# Patient Record
Sex: Female | Born: 2000
Health system: Southern US, Community
[De-identification: ages and names within clinical notes are randomized; demographics above are authoritative.]

---
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
## 2019-01-09 DIAGNOSIS — S20219A Contusion of unspecified front wall of thorax, initial encounter: Secondary | ICD-10-CM | POA: Diagnosis not present

## 2019-01-09 MED FILL — DICLOFENAC SODIUM 75 MG TAB: 75 | 14 days supply | Qty: 28 | Fill #0

## 2019-02-17 DIAGNOSIS — Z23 Encounter for immunization: Secondary | ICD-10-CM | POA: Diagnosis not present

## 2019-04-14 ENCOUNTER — Other Ambulatory Visit: Payer: Self-pay | Admitting: Family Medicine

## 2019-04-14 ENCOUNTER — Other Ambulatory Visit: Payer: Self-pay

## 2019-04-14 ENCOUNTER — Encounter (HOSPITAL_COMMUNITY): Payer: Self-pay

## 2019-04-14 ENCOUNTER — Ambulatory Visit (HOSPITAL_COMMUNITY)
Admission: EM | Admit: 2019-04-14 | Discharge: 2019-04-14 | Disposition: A | Discharge status: Home / Self Care | Payer: 59 | Attending: Family Medicine | Admitting: Family Medicine

## 2019-04-14 DIAGNOSIS — R300 Dysuria: Secondary | ICD-10-CM | POA: Diagnosis not present

## 2019-04-14 DIAGNOSIS — Z113 Encounter for screening for infections with a predominantly sexual mode of transmission: Secondary | ICD-10-CM | POA: Insufficient documentation

## 2019-04-14 LAB — POCT URINALYSIS DIP (DEVICE)
Bilirubin Urine: NEGATIVE
Glucose, UA: NEGATIVE mg/dL
Ketones, ur: NEGATIVE mg/dL
Leukocytes,Ua: NEGATIVE
Nitrite: NEGATIVE
Protein, ur: NEGATIVE mg/dL
Specific Gravity, Urine: <=1.005 (ref 1.005–1.030)
Urobilinogen, UA: 0.2 mg/dL (ref 0.0–1.0)
pH: 6 (ref 5.0–8.0)

## 2019-04-14 MED ORDER — CEPHALEXIN 500 MG PO CAPS: 500.0000 mg | ORAL_CAPSULE | Freq: Two times a day (BID) | ORAL | 0 refills | Status: DC

## 2019-04-14 NOTE — ED Triage Notes (Signed)
Pt presents with urinary tract symptoms; pressure on bladder and abdominal pain.

## 2019-04-14 NOTE — Discharge Instructions (Addendum)
We have sent testing for sexually transmitted infections along with a urine culture. We will notify you of any positive results once they are received. If required, we will prescribe any medications you might need.  Please refrain from all sexual activity for at least the next seven days.  

## 2019-04-14 NOTE — ED Provider Notes (Signed)
Lori Gomez   [id number] 04/14/19 Arrival Time: 1841  ASSESSMENT & PLAN:  1. Dysuria   2. Screening for STDs (sexually transmitted diseases)     Declines empiric tx for gonorrhea/chlamydia. Prefers to await cytology results. No signs of PID. Will treat for suspected UTI.   Discharge Instructions     We have sent testing for sexually transmitted infections along with a urine culture. We will notify you of any positive results once they are received. If required, we will prescribe any medications you might need.  Please refrain from all sexual activity for at least the next seven days.     Pending: Labs Reviewed  POCT URINALYSIS DIP (DEVICE) - Abnormal; Notable for the following components:      Result Value   Hgb urine dipstick TRACE (*)    All other components within normal limits  URINE CULTURE  CERVICOVAGINAL ANCILLARY ONLY    Will notify of any positive results. Instructed to refrain from sexual activity for at least seven days.  Reviewed expectations re: course of current medical issues. Questions answered. Outlined signs and symptoms indicating need for more acute intervention. Patient verbalized understanding. After Visit Summary given.   SUBJECTIVE:  Lori Gomez is a 18 y.o. female who presents with complaint of dysuria. Occasional urinary frequency. Onset gradual. First noticed "maybe a week or so ago"; worse the past few days. No vaginal discharge or bleeding. Afebrile. No abdominal or pelvic pain. No n/v. No rashes or lesions. Reports that she s sexually active with single female partner.Normal PO intake without n/v/d. OTC treatment: none. History of STI: No. No h/o recurrent UTI. No new medications.  Patient's last menstrual period was 04/06/2019.  ROS: As per HPI. All other systems negative.   OBJECTIVE:  Vitals:   04/14/19 1853  BP: 127/73  Pulse: 81  Resp: 18  Temp: 98 F (36.7 C)  TempSrc: Oral  SpO2: 100%     General  appearance: alert, cooperative, appears stated age and no distress Throat: lips, mucosa, and tongue normal; teeth and gums normal CV: RRR Lungs: CTAB Back: no CVA tenderness; FROM at waist Abdomen: soft, non-tender; non-distended GU: deferred Skin: warm and dry Ext: no LE edema Psychological: alert and cooperative; normal mood and affect.  Results for orders placed or performed during the hospital encounter of 04/14/19  POCT urinalysis dip (device)  Result Value Ref Range   Glucose, UA NEGATIVE NEGATIVE mg/dL   Bilirubin Urine NEGATIVE NEGATIVE   Ketones, ur NEGATIVE NEGATIVE mg/dL   Specific Gravity, Urine <=1.005 1.005 - 1.030   Hgb urine dipstick TRACE (A) NEGATIVE   pH 6.0 5.0 - 8.0   Protein, ur NEGATIVE NEGATIVE mg/dL   Urobilinogen, UA 0.2 0.0 - 1.0 mg/dL   Nitrite NEGATIVE NEGATIVE   Leukocytes,Ua NEGATIVE NEGATIVE    Labs Reviewed  POCT URINALYSIS DIP (DEVICE) - Abnormal; Notable for the following components:      Result Value   Hgb urine dipstick TRACE (*)    All other components within normal limits  URINE CULTURE  CERVICOVAGINAL ANCILLARY ONLY    Allergies  Allergen Reactions  . Kiwi Extract Hives    PMH: "Healthy". FH: Question of HTN. Social History   Socioeconomic History  . Marital status: Single    Spouse name: Not on file  . Number of children: Not on file  . Years of education: Not on file  . Highest education level: Not on file  Occupational History  . Not on file  Social Needs  . Financial resource strain: Not on file  . Food insecurity    Worry: Not on file    Inability: Not on file  . Transportation needs    Medical: Not on file    Non-medical: Not on file  Tobacco Use  . Smoking status: Never Smoker  . Smokeless tobacco: Never Used  Substance and Sexual Activity  . Alcohol use: Not on file  . Drug use: Not on file  . Sexual activity: Not on file  Lifestyle  . Physical activity    Days per week: Not on file    Minutes per  session: Not on file  . Stress: Not on file  Relationships  . Social Musician on phone: Not on file    Gets together: Not on file    Attends religious service: Not on file    Active member of club or organization: Not on file    Attends meetings of clubs or organizations: Not on file    Relationship status: Not on file  . Intimate partner violence    Fear of current or ex partner: Not on file    Emotionally abused: Not on file    Physically abused: Not on file    Forced sexual activity: Not on file  Other Topics Concern  . Not on file  Social History Narrative  . Not on file          Lori Layman, MD 04/14/19 [phone]

## 2019-04-15 LAB — CERVICOVAGINAL ANCILLARY ONLY
Bacterial Vaginitis (gardnerella): NEGATIVE
Candida Glabrata: POSITIVE — AB
Candida Vaginitis: NEGATIVE
Molecular Disclaimer: NEGATIVE
Molecular Disclaimer: NEGATIVE
Molecular Disclaimer: NEGATIVE
Molecular Disclaimer: NORMAL
Trichomonas: NEGATIVE

## 2019-04-15 LAB — URINE CULTURE: Culture: NO GROWTH

## 2019-04-15 MED FILL — CEPHALEXIN 500 MG CAPSULE: 500 | 5 days supply | Qty: 10 | Fill #0

## 2019-04-16 ENCOUNTER — Telehealth (HOSPITAL_COMMUNITY): Payer: Self-pay | Admitting: Emergency Medicine

## 2019-04-16 LAB — CERVICOVAGINAL ANCILLARY ONLY
Chlamydia: NEGATIVE
Neisseria Gonorrhea: NEGATIVE

## 2019-04-16 MED ORDER — FLUCONAZOLE 150 MG PO TABS: 150.0000 mg | ORAL_TABLET | Freq: Once | ORAL | 0 refills | Status: AC

## 2019-04-16 NOTE — Telephone Encounter (Signed)
Test for candida (yeast) was positive.  Prescription for fluconazole 150mg po now, repeat dose in 3d if needed, #2 no refills, sent to the pharmacy of record.  Recheck or followup with PCP for further evaluation if symptoms are not improving.    Patient contacted and made aware of    results, all questions answered   

## 2019-04-17 MED FILL — FLUCONAZOLE 150 MG TABLET: 150 | 2 days supply | Qty: 2 | Fill #0

## 2019-05-06 MED FILL — HYDROCODON-APAP 7.5-325: 7.5-325 | 2 days supply | Qty: 8 | Fill #0

## 2019-05-06 MED FILL — KETOROLAC 10 MG TABLET: 10 | 4 days supply | Qty: 16 | Fill #0

## 2019-06-03 DIAGNOSIS — M25552 Pain in left hip: Secondary | ICD-10-CM | POA: Diagnosis not present

## 2019-10-02 ENCOUNTER — Other Ambulatory Visit: Payer: Self-pay

## 2019-10-02 ENCOUNTER — Ambulatory Visit (INDEPENDENT_AMBULATORY_CARE_PROVIDER_SITE_OTHER): Payer: 59 | Admitting: Addiction (Substance Use Disorder)

## 2019-10-02 ENCOUNTER — Encounter: Payer: Self-pay | Admitting: Addiction (Substance Use Disorder)

## 2019-10-02 ENCOUNTER — Encounter (INDEPENDENT_AMBULATORY_CARE_PROVIDER_SITE_OTHER): Payer: Self-pay

## 2019-10-02 DIAGNOSIS — F4310 Post-traumatic stress disorder, unspecified: Secondary | ICD-10-CM

## 2019-10-02 DIAGNOSIS — F331 Major depressive disorder, recurrent, moderate: Secondary | ICD-10-CM | POA: Diagnosis not present
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
## 2019-10-06 ENCOUNTER — Ambulatory Visit (INDEPENDENT_AMBULATORY_CARE_PROVIDER_SITE_OTHER): Payer: 59 | Admitting: Adult Health

## 2019-10-06 ENCOUNTER — Encounter: Payer: Self-pay | Admitting: Adult Health

## 2019-10-06 VITALS — BP 124/81 | HR 76 | Ht 68.0 in | Wt 145.0 lb

## 2019-10-06 DIAGNOSIS — F411 Generalized anxiety disorder: Secondary | ICD-10-CM | POA: Diagnosis not present

## 2019-10-06 DIAGNOSIS — F331 Major depressive disorder, recurrent, moderate: Secondary | ICD-10-CM | POA: Diagnosis not present

## 2019-10-06 DIAGNOSIS — F4310 Post-traumatic stress disorder, unspecified: Secondary | ICD-10-CM | POA: Diagnosis not present

## 2019-10-06 DIAGNOSIS — F422 Mixed obsessional thoughts and acts: Secondary | ICD-10-CM

## 2019-10-06 MED ORDER — FLUOXETINE HCL 10 MG PO CAPS
ORAL_CAPSULE | ORAL | 2 refills | Status: DC
Start: 2019-10-06 — End: 2019-11-03

## 2019-10-06 MED FILL — FLUoxetine HCL 10 MG CAPS: 10 | 30 days supply | Qty: 60 | Fill #0
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
## 2019-10-30 DIAGNOSIS — R0789 Other chest pain: Secondary | ICD-10-CM | POA: Diagnosis not present

## 2019-10-30 DIAGNOSIS — F419 Anxiety disorder, unspecified: Secondary | ICD-10-CM | POA: Diagnosis not present

## 2019-10-30 DIAGNOSIS — R002 Palpitations: Secondary | ICD-10-CM | POA: Diagnosis not present

## 2019-11-03 ENCOUNTER — Encounter: Payer: Self-pay | Admitting: Adult Health

## 2019-11-03 ENCOUNTER — Other Ambulatory Visit: Payer: Self-pay

## 2019-11-03 ENCOUNTER — Telehealth: Payer: Self-pay | Admitting: Adult Health

## 2019-11-03 ENCOUNTER — Ambulatory Visit (INDEPENDENT_AMBULATORY_CARE_PROVIDER_SITE_OTHER): Payer: 59 | Admitting: Adult Health

## 2019-11-03 DIAGNOSIS — F422 Mixed obsessional thoughts and acts: Secondary | ICD-10-CM | POA: Diagnosis not present

## 2019-11-03 DIAGNOSIS — F411 Generalized anxiety disorder: Secondary | ICD-10-CM

## 2019-11-03 DIAGNOSIS — F4310 Post-traumatic stress disorder, unspecified: Secondary | ICD-10-CM

## 2019-11-03 DIAGNOSIS — F331 Major depressive disorder, recurrent, moderate: Secondary | ICD-10-CM | POA: Diagnosis not present

## 2019-11-03 MED ORDER — BUPROPION HCL ER (XL) 150 MG PO TB24: 150.0000 mg | ORAL_TABLET | Freq: Every day | ORAL | 2 refills | Status: DC

## 2019-11-03 MED FILL — buPROPion HCL ER (XL) 150 M: 150 | 30 days supply | Qty: 30 | Fill #0

## 2019-11-03 NOTE — Progress Notes (Signed)
PAT Lori Gomez [id number] [birth year] 18 y.o.  Subjective:   Patient ID:  Lori Gomez is a 19 y.o. (DOB [birth year]) female.  Chief Complaint: No chief complaint on file.   HPI Lori Gomez presents to the office today for follow-up of MDD, GAD, OCD, PTSD.   Describes mood today as "not good".  Pleasant. Flat. Mood symptoms - reports depression, anxiety, and irritability. Increased worry and rumination - schoolwork - getting assignments done. Having panic attacks. Did not tolerate Prozac - made her "more anxious, nauseous, and vomiting. Stating "I feel sad". Can't cut her mind off to go to sleep at night. Ritual and routines - checking stove, doors, taking clothes off if she doesn't put them on right. Anxious in crowds. Stable interest and motivation. Taking medications as prescribed.  Energy levels low. Active, does not have a regular exercise routine. 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 Full time student at [employer] - "classes ok".  Enjoys some usual interests and activities. Single. Dating. Lives at home with parents - 1 brother and 1 sister. Spending time with family. Extended family local. Appetite adequate.  Weight stable - 145 pounds. Sleeps better some nights than others. Averages 4 hours. Unable to turn her "brain" off.  Focus and concentration difficulties. Diagnosed with ADHD in second grade. Has taken stimulants. Is at [employer] currently and majoring in [employer]. Completing tasks. Managing aspects of household.  Denies SI or HI. Denies AH or VH.   Review of Systems:  Review of Systems  Musculoskeletal: Negative for gait problem.  Neurological: Negative for tremors.  Psychiatric/Behavioral:       Please refer to HPI    Medications: I have reviewed the patient's current medications.  Current Outpatient Medications  Medication Sig Dispense Refill  . buPROPion (WELLBUTRIN XL) 150 MG 24 hr tablet Take 1  tablet (150 mg total) by mouth daily. 30 tablet 2   No current facility-administered medications for this visit.    Medication Side Effects: None  Allergies:  Allergies  Allergen Reactions  . Kiwi Extract Hives    No past medical history on file.  Family History  Family history unknown: Yes    Social History   Socioeconomic History  . Marital status: Single    Spouse name: Not on file  . Number of children: Not on file  . Years of education: Not on file  . Highest education level: Not on file  Occupational History  . Not on file  Tobacco Use  . Smoking status: Never Smoker  . Smokeless tobacco: Never Used  Substance and Sexual Activity  . Alcohol use: Not on file  . Drug use: Not on file  . Sexual activity: Not on file  Other Topics Concern  . Not on file  Social History Narrative  . Not on file   Social Determinants of Health   Financial Resource Strain:   . Difficulty of Paying Living Expenses:   Food Insecurity:   . Worried About Programme researcher, broadcasting/film/video in the Last Year:   . Barista in the Last Year:   Transportation Needs:   . Freight forwarder (Medical):   Marland Kitchen Lack of Transportation (Non-Medical):   Physical Activity:   . Days of Exercise per Week:   . Minutes of Exercise per Session:   Stress:   . Feeling of Stress :   Social Connections:   .  Frequency of Communication with Friends and Family:   . Frequency of Social Gatherings with Friends and Family:   . Attends Religious Services:   . Active Member of Clubs or Organizations:   . Attends Archivist Meetings:   Marland Kitchen Marital Status:   Intimate Partner Violence:   . Fear of Current or Ex-Partner:   . Emotionally Abused:   Marland Kitchen Physically Abused:   . Sexually Abused:     Past Medical History, Surgical history, Social history, and Family history were reviewed and updated as appropriate.   Please see review of systems for further details on the patient's review from today.    Objective:   Physical Exam:  There were no vitals taken for this visit.  Physical Exam Constitutional:      General: She is not in acute distress. Musculoskeletal:        General: No deformity.  Neurological:     Mental Status: She is alert and oriented to person, place, and time.     Coordination: Coordination normal.  Psychiatric:        Attention and Perception: Attention and perception normal. She does not perceive auditory or visual hallucinations.        Mood and Affect: Mood is anxious and depressed. Affect is not labile, blunt, angry or inappropriate.        Speech: Speech normal.        Behavior: Behavior normal.        Thought Content: Thought content normal. Thought content is not paranoid or delusional. Thought content does not include homicidal or suicidal ideation. Thought content does not include homicidal or suicidal plan.        Cognition and Memory: Cognition and memory normal.        Judgment: Judgment normal.     Comments: Insight intact     Lab Review:  No results found for: NA, K, CL, CO2, GLUCOSE, BUN, CREATININE, CALCIUM, PROT, ALBUMIN, AST, ALT, ALKPHOS, BILITOT, GFRNONAA, GFRAA  No results found for: WBC, RBC, HGB, HCT, PLT, MCV, MCH, MCHC, RDW, LYMPHSABS, MONOABS, EOSABS, BASOSABS  No results found for: POCLITH, LITHIUM   No results found for: PHENYTOIN, PHENOBARB, VALPROATE, CBMZ   .res Assessment: Plan:    Plan:  PDMP reviewed  1. D/C Prozac 20mg  daily 2. Add Wellbutrin XL 150mg  every morning  Continue therapy - Sammuel Cooper  Out of work 10/30/2019 through 12/14/2019    RTC 4 weeks  Spoke with mother via telephone about patient during interview and agreed on Wellbutrin. Mother taking Wellbutrin and has similar symptoms and has not tolerated SSRI's.   Patient advised to contact office with any questions, adverse effects, or acute worsening in signs and symptoms. Diagnoses and all orders for this visit:  Mixed obsessional thoughts  and acts  Major depressive disorder, recurrent episode, moderate ([facility]) -     buPROPion (WELLBUTRIN XL) 150 MG 24 hr tablet; Take 1 tablet (150 mg total) by mouth daily.  Generalized anxiety disorder  PTSD (post-traumatic stress disorder)     Please see After Visit Summary for patient specific instructions.  Future Appointments  Date Time Provider [place]  11/05/2019  1:00 PM Barnie Del, LCSW CP-CP None  11/19/2019  1:00 PM Barnie Del, LCSW CP-CP None  11/27/2019  8:00 AM Barnie Del, LCSW CP-CP None  12/04/2019  8:00 AM Barnie Del, LCSW CP-CP None  12/11/2019  8:00 AM Barnie Del, LCSW CP-CP None  12/18/2019  8:00 AM  Pauline Good, LCSW CP-CP None    No orders of the defined types were placed in this encounter.   -------------------------------

## 2019-11-03 NOTE — Telephone Encounter (Signed)
Error msg in IM

## 2019-11-05 ENCOUNTER — Other Ambulatory Visit: Payer: Self-pay

## 2019-11-05 ENCOUNTER — Encounter: Payer: Self-pay | Admitting: Addiction (Substance Use Disorder)

## 2019-11-05 ENCOUNTER — Ambulatory Visit (INDEPENDENT_AMBULATORY_CARE_PROVIDER_SITE_OTHER): Payer: 59 | Admitting: Addiction (Substance Use Disorder)

## 2019-11-05 DIAGNOSIS — F411 Generalized anxiety disorder: Secondary | ICD-10-CM

## 2019-11-05 DIAGNOSIS — F4310 Post-traumatic stress disorder, unspecified: Secondary | ICD-10-CM | POA: Diagnosis not present

## 2019-11-05 NOTE — Progress Notes (Signed)
[facility] Counselor/Therapist Progress Note  Patient ID: Lori Gomez, MRN: [id number],    Date: 11/05/2019  Time Spent: 1:06-2:00 54 mins  Treatment Type: Individual Therapy  Reported Symptoms: panic attacks, racing thoughts, skin crawling, disassociation, wasn't eating or sleeping, isolating, depressed.   Mental Status Exam:  Appearance:   Neat     Behavior:  Appropriate  Motor:  Normal  Speech/Language:   Normal Rate  Affect:  Congruent and Full Range  Mood:  anxious, depressed, irritable and sad  Thought process:  blocked  Thought content:    Obsessions and Rumination  Sensory/Perceptual disturbances:    Flashback  Orientation:  x4  Attention:  Good  Concentration:  Fair  Memory:  WNL  Fund of knowledge:   Good  Insight:    Good  Judgment:   Fair  Impulse Control:  Fair   Risk Assessment: Danger to Self:  No Self-injurious Behavior: No Danger to Others: No Duty to Warn:no Physical Aggression / Violence:No  Access to Firearms a concern: No  Gang Involvement:No   Subjective: Client identified having a lot of panic attacks, racing thoughts, skin crawling, disassociation, etc with taking Prozac. Client discussed her change of medication to help treat her depression and PTSD. Client processed how she struggles with depression, hopelessness, and isolating. Therapist used MI to provide support for the client's struggle and assessed for safety. Client declined SI/HI/AVH and reported improvement with new medication. Client discussed attachment issues and traumas that cause disruption in relationships. Client reported that its easier to admit that shes mad than sad.Therapist used MI & CBT to help client have support and explore the trigger for isolating herself and running away. Therapist used psychoeducation to teach client about trauma & the fight or flight reactions that come when theres threat, normalizing what she experiences and helping her use DBT techniques to ground  her in the moment of disassociation or trauma flooding. Client shared that when relationships get vulnerable, too real, too intimate, she feels exposed, naked, and afraid. Client made progress identifying her patterns and discussing how they affect relationship dynamics. Client reported hiding her vulnerability and sexuality from her dad to protect her relationship with her dad. Therapist used grief therapy and family therapy to help client work through the fears to help her develop a deeper connection with her dad and feel connected to him. Therapist used roleplaying to help client consider topics of discussion with him. Therapist also used 12 steps to encourage client to continue considering pros and cons of her THC smoking and her motivations for stopping.   Interventions: Cognitive Behavioral Therapy, Dialectical Behavioral Therapy, Motivational Interviewing, 12-Step, Grief Therapy and Psycho-education/Bibliotherapy  Diagnosis:   ICD-10-CM   1. PTSD (post-traumatic stress disorder)  F43.10   2. Generalized anxiety disorder  F41.1     Plan of Care:  Client is to return to therapy with therapist every 1-2 weeks as needed to process pain/grief/traumas in a safe space, to be re-evaluated in 3 months.  Client is to consider seeing a medication provider to explore possible need for medication to help them feel more like themselves. Client is to practice mindfulness AEB daily meditation and body scans or as needed when flooded by emotion/pain/flashbacks or feeling numb.  Client is to learn/practice DBT wise mind & radical acceptance AEB understanding they don't have to live in extremes and can feel both happy and sad emotions, and good and bad memories and allowing themselves to do so.  Client is  to practice self-compassion AEB being gentle with themselves, utilizing self-care techniques daily or as needed when grieving something in the moment.  Client is to process grief/pain in a somatic body-felt  sense way: ie using mindfulness, brainspotting, or trauma release as a method for releasing body pain/tension caused by grief. Client to step outside of her comfort zone to really be "herself".  Pauline Good, LCSW, LCAS, CCTP, CCS-I, BSP

## 2019-11-09 DIAGNOSIS — M542 Cervicalgia: Secondary | ICD-10-CM | POA: Diagnosis not present

## 2019-11-09 DIAGNOSIS — M549 Dorsalgia, unspecified: Secondary | ICD-10-CM | POA: Diagnosis not present

## 2019-11-09 MED FILL — BACLOFEN 5 MG TABS: 5 | 5 days supply | Qty: 10 | Fill #0

## 2019-11-10 ENCOUNTER — Other Ambulatory Visit: Payer: Self-pay

## 2019-11-10 ENCOUNTER — Ambulatory Visit: Payer: 59 | Admitting: Addiction (Substance Use Disorder)

## 2019-11-10 ENCOUNTER — Ambulatory Visit (INDEPENDENT_AMBULATORY_CARE_PROVIDER_SITE_OTHER): Payer: 59 | Admitting: Addiction (Substance Use Disorder)

## 2019-11-10 DIAGNOSIS — F4310 Post-traumatic stress disorder, unspecified: Secondary | ICD-10-CM | POA: Diagnosis not present

## 2019-11-10 DIAGNOSIS — F411 Generalized anxiety disorder: Secondary | ICD-10-CM

## 2019-11-11 ENCOUNTER — Encounter: Payer: Self-pay | Admitting: Addiction (Substance Use Disorder)
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
## 2019-11-14 ENCOUNTER — Ambulatory Visit: Payer: 59 | Attending: Internal Medicine

## 2019-11-14 DIAGNOSIS — Z23 Encounter for immunization: Secondary | ICD-10-CM

## 2019-11-14 NOTE — Progress Notes (Signed)
   Covid-19 Vaccination Clinic  Name:  LUXE CUADROS    MRN: [id number] DOB: 12-Jun-2001  11/14/2019  Ms. Backes was observed post Covid-19 immunization for 15 minutes without incident. She was provided with Vaccine Information Sheet and instruction to access the V-Safe system.   Ms. Nuss was instructed to call 911 with any severe reactions post vaccine: Marland Kitchen Difficulty breathing  . Swelling of face and throat  . A fast heartbeat  . A bad rash all over body  . Dizziness and weakness   Immunizations Administered    Name Date Dose VIS Date Route   Pfizer COVID-19 Vaccine 11/14/2019  2:08 PM 0.3 mL 09/16/2018 Intramuscular   Manufacturer: [employer], [employer]   Lot: [id number]   NDC: [address]-4

## 2019-11-19 ENCOUNTER — Other Ambulatory Visit: Payer: Self-pay

## 2019-11-19 ENCOUNTER — Ambulatory Visit (INDEPENDENT_AMBULATORY_CARE_PROVIDER_SITE_OTHER): Payer: 59 | Admitting: Addiction (Substance Use Disorder)

## 2019-11-19 ENCOUNTER — Encounter: Payer: Self-pay | Admitting: Addiction (Substance Use Disorder)

## 2019-11-19 DIAGNOSIS — F411 Generalized anxiety disorder: Secondary | ICD-10-CM

## 2019-11-19 DIAGNOSIS — F4310 Post-traumatic stress disorder, unspecified: Secondary | ICD-10-CM

## 2019-11-19 DIAGNOSIS — F3181 Bipolar II disorder: Secondary | ICD-10-CM | POA: Diagnosis not present

## 2019-11-19 NOTE — Progress Notes (Signed)
[facility] Counselor/Therapist Progress Note  Patient ID: Lori Gomez, MRN: [id number],    Date: 11/19/2019  Time Spent: 1:06-2:00 54 mins  Treatment Type: Individual Therapy  Reported Symptoms: feeling good, less depressed, isolating, hopeful about semester ending.   Mental Status Exam:  Appearance:   Casual     Behavior:  Appropriate  Motor:  Normal  Speech/Language:   Normal Rate  Affect:  Congruent and Full Range  Mood:  euthymic  Thought process:  circumstantial and concrete  Thought content:    Obsessions and Rumination  Sensory/Perceptual disturbances:    Flashback  Orientation:  x4  Attention:  Good  Concentration:  Fair  Memory:  WNL  Fund of knowledge:   Good  Insight:    Good  Judgment:   Good  Impulse Control:  Fair   Risk Assessment: Danger to Self:  No Self-injurious Behavior: No Danger to Others: No Duty to Warn:no Physical Aggression / Violence:No  Access to Firearms a concern: No  Gang Involvement:No   Subjective: Client identified most recently feeling "better" following the last session, and being more aware of the guilt that shes choosing to take on as her own. Client making progress by realizing what I can and cant control. Therapist further worked with client on this using PST& SFT to set goals for herself to help her not get irritated/triggered as much. Client identified possible mood swings between hypomania and depression rapidly (and sometimes daily) and due to circumstances. Client reported feeling less depressed since starting Wellbutrin but still having some of the big mood swings that come with obsessions, compulsions, high energy, grandiose thoughts, etc. Client reported also sleeping only like 4 unconsecutive hours total a night and having racing thoughts, for the last 2 weeks. Client reported some of this may be magnified by circumstantial things such as: being more hopeful about college semester ending & isolating herself from toxic  people that cause her stress/anger/ and sadness. Therapist taught client DBT techniques such as: emotion regulation and distress tolerance skills to help client find ways to channel her frustration and psychoeducation to help her understand this from a trauma perspective. Therapist used MI to help validate client's distress and taught her have compassion on herself while she learns a new pattern. Client reported gaining insight & learning how to express emotions.  Interventions: Dialectical Behavioral Therapy, Motivational Interviewing and Solution-Oriented/Positive Psychology  Diagnosis:   ICD-10-CM   1. PTSD (post-traumatic stress disorder)  F43.10   2. Generalized anxiety disorder  F41.1   3. Bipolar II disorder (HCC)  F31.81     Plan of Care:  Client is to return to therapy with therapist every 1-2 weeks as needed to process pain/grief/traumas in a safe space, to be re-evaluated in 3 months.  Client is to consider seeing a medication provider to explore possible need for medication to help them feel more like themselves. Client is to practice mindfulness AEB daily meditation and body scans or as needed when flooded by emotion/pain/flashbacks or feeling numb.  Client is to learn/practice DBT wise mind & radical acceptance AEB understanding they don't have to live in extremes and can feel both happy and sad emotions, and good and bad memories and allowing themselves to do so.  Client is to practice self-compassion AEB being gentle with themselves, utilizing self-care techniques daily or as needed when grieving something in the moment.  Client is to process grief/pain in a somatic body-felt sense way: ie using mindfulness, brainspotting, or  trauma release as a method for releasing body pain/tension caused by grief. Client to step outside of her comfort zone to really be "herself".  Barnie Del, LCSW, LCAS, CCTP, CCS-I, BSP

## 2019-11-27 ENCOUNTER — Encounter: Payer: Self-pay | Admitting: Addiction (Substance Use Disorder)

## 2019-11-27 ENCOUNTER — Ambulatory Visit (INDEPENDENT_AMBULATORY_CARE_PROVIDER_SITE_OTHER): Payer: 59 | Admitting: Addiction (Substance Use Disorder)

## 2019-11-27 ENCOUNTER — Other Ambulatory Visit: Payer: Self-pay

## 2019-11-27 DIAGNOSIS — F411 Generalized anxiety disorder: Secondary | ICD-10-CM | POA: Diagnosis not present

## 2019-11-27 DIAGNOSIS — F4310 Post-traumatic stress disorder, unspecified: Secondary | ICD-10-CM | POA: Diagnosis not present
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
## 2019-12-01 ENCOUNTER — Other Ambulatory Visit: Payer: Self-pay

## 2019-12-01 ENCOUNTER — Ambulatory Visit (INDEPENDENT_AMBULATORY_CARE_PROVIDER_SITE_OTHER): Payer: 59 | Admitting: Adult Health

## 2019-12-01 ENCOUNTER — Encounter: Payer: Self-pay | Admitting: Adult Health

## 2019-12-01 DIAGNOSIS — F3181 Bipolar II disorder: Secondary | ICD-10-CM

## 2019-12-01 DIAGNOSIS — F331 Major depressive disorder, recurrent, moderate: Secondary | ICD-10-CM

## 2019-12-01 DIAGNOSIS — F4310 Post-traumatic stress disorder, unspecified: Secondary | ICD-10-CM

## 2019-12-01 DIAGNOSIS — F422 Mixed obsessional thoughts and acts: Secondary | ICD-10-CM | POA: Diagnosis not present

## 2019-12-01 DIAGNOSIS — F411 Generalized anxiety disorder: Secondary | ICD-10-CM | POA: Diagnosis not present
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
## 2019-12-04 ENCOUNTER — Encounter: Payer: Self-pay | Admitting: Addiction (Substance Use Disorder)

## 2019-12-04 ENCOUNTER — Ambulatory Visit (INDEPENDENT_AMBULATORY_CARE_PROVIDER_SITE_OTHER): Payer: 59 | Admitting: Addiction (Substance Use Disorder)

## 2019-12-04 ENCOUNTER — Other Ambulatory Visit: Payer: Self-pay

## 2019-12-04 DIAGNOSIS — F411 Generalized anxiety disorder: Secondary | ICD-10-CM

## 2019-12-04 DIAGNOSIS — F4310 Post-traumatic stress disorder, unspecified: Secondary | ICD-10-CM

## 2019-12-04 NOTE — Progress Notes (Signed)
[facility] Counselor/Therapist Progress Note  Patient ID: ALESA ECHEVARRIA, MRN: [id number],    Date: 12/04/2019  Time Spent: 8:10-9:04  Treatment Type: Individual Therapy  Reported Symptoms: less dull feelings, pain from surgery, continued obsessive thoughts.    Mental Status Exam:  Appearance:   Casual     Behavior:  Appropriate  Motor:  Normal  Speech/Language:   Normal Rate  Affect:  Congruent and Full Range  Mood:  anxious and irritable  Thought process:  circumstantial and concrete  Thought content:    Obsessions and Rumination  Sensory/Perceptual disturbances:    Flashback  Orientation:  x4  Attention:  Good  Concentration:  Fair  Memory:  WNL  Fund of knowledge:   Good  Insight:    Good  Judgment:   Good  Impulse Control:  Fair   Risk Assessment: Danger to Self:  No Self-injurious Behavior: No Danger to Others: No Duty to Warn:no Physical Aggression / Violence:No  Access to Firearms a concern: No  Gang Involvement:No   Subjective: Client reported stopping her SSRI because it made her feel sleepy and dull/numb, but still having obsessive thoughts, anxiety, and trouble sleeping. Client discussed her goal of not wanting to live in fear anymore and fighting the obsessions: that include not being safe leaving the house or hanging out with new people. Client has also had less stress due to the semester being over and feeling acocmplished by making all As. Therapist used MI  to validate client's progress and encourage her to continue learning how to manage her anxiety and SFT to help her find the solution to help her sleep and decreasing her racing mind. Client described other stressors & reported getting her wisdom teeth out yesterday and being in a lot of pain and not having slept last night. Client shared about an allergic reaction to the anesthesia causing hives and her throat to close up. Client discussed her need for pain relief and trying to stop smoking THC  and using CBD instead (harm reduction) for anxiety also. Client shared an argument with her mom about her control of the client, and therapist used family systems with client.  Interventions: Motivational Interviewing, Solution-Oriented/Positive Psychology, Family Systems and harm reduction  Diagnosis:   ICD-10-CM   1. Generalized anxiety disorder  F41.1   2. PTSD (post-traumatic stress disorder)  F43.10     Plan of Care:  Client is to return to therapy with therapist every 1-2 weeks as needed to process pain/grief/traumas in a safe space, to be re-evaluated in 3 months.  Client is to consider seeing a medication provider to explore possible need for medication to help them feel more like themselves. Client is to practice mindfulness AEB daily meditation and body scans or as needed when flooded by emotion/pain/flashbacks or feeling numb.  Client is to learn/practice DBT wise mind & radical acceptance AEB understanding they don't have to live in extremes and can feel both happy and sad emotions, and good and bad memories and allowing themselves to do so.  Client is to practice self-compassion AEB being gentle with themselves, utilizing self-care techniques daily or as needed when grieving something in the moment.  Client is to process grief/pain in a somatic body-felt sense way: ie using mindfulness, brainspotting, or trauma release as a method for releasing body pain/tension caused by grief. Client to step outside of her comfort zone to really be "herself".  Pauline Good, LCSW, LCAS, CCTP, CCS-I, BSP

## 2019-12-07 ENCOUNTER — Ambulatory Visit: Payer: 59 | Attending: Internal Medicine

## 2019-12-07 DIAGNOSIS — Z23 Encounter for immunization: Secondary | ICD-10-CM

## 2019-12-07 NOTE — Progress Notes (Signed)
   Covid-19 Vaccination Clinic  Name:  Lori Gomez    MRN: [id number] DOB: 30-Apr-2001  12/07/2019  Lori Gomez was observed post Covid-19 immunization for 15 minutes without incident. She was provided with Vaccine Information Sheet and instruction to access the V-Safe system.   Lori Gomez was instructed to call 911 with any severe reactions post vaccine: Marland Kitchen Difficulty breathing  . Swelling of face and throat  . A fast heartbeat  . A bad rash all over body  . Dizziness and weakness   Immunizations Administered    Name Date Dose VIS Date Route   Pfizer COVID-19 Vaccine 12/07/2019  8:34 AM 0.3 mL 09/16/2018 Intramuscular   Manufacturer: [employer], [employer]   Lot: [id number]   NDC: [address]-6

## 2019-12-11 ENCOUNTER — Other Ambulatory Visit: Payer: Self-pay

## 2019-12-11 ENCOUNTER — Ambulatory Visit (INDEPENDENT_AMBULATORY_CARE_PROVIDER_SITE_OTHER): Payer: 59 | Admitting: Addiction (Substance Use Disorder)

## 2019-12-11 ENCOUNTER — Encounter: Payer: Self-pay | Admitting: Addiction (Substance Use Disorder)

## 2019-12-11 DIAGNOSIS — F4310 Post-traumatic stress disorder, unspecified: Secondary | ICD-10-CM | POA: Diagnosis not present

## 2019-12-11 DIAGNOSIS — F422 Mixed obsessional thoughts and acts: Secondary | ICD-10-CM | POA: Diagnosis not present

## 2019-12-11 DIAGNOSIS — F411 Generalized anxiety disorder: Secondary | ICD-10-CM | POA: Diagnosis not present

## 2019-12-11 DIAGNOSIS — F3181 Bipolar II disorder: Secondary | ICD-10-CM | POA: Diagnosis not present

## 2019-12-11 NOTE — Progress Notes (Signed)
      [facility] Counselor/Therapist Progress Note  Patient ID: Lori Gomez, MRN: [id number],    Date: 12/11/2019  Time Spent: 8:08-9:02 54 mins  Treatment Type: Individual Therapy  Reported Symptoms: Nervous about upcoming jobs & overwhelmed with housework.  Mental Status Exam:  Appearance:   Casual and Well Groomed     Behavior:  Appropriate  Motor:  Normal  Speech/Language:   Normal Rate  Affect:  Appropriate, Congruent and Full Range  Mood:  anxious  Thought process:  normal  Thought content:    Obsessions and Rumination  Sensory/Perceptual disturbances:    Flashback  Orientation:  x4  Attention:  Good  Concentration:  Fair  Memory:  WNL  Fund of knowledge:   Good  Insight:    Good  Judgment:   Good  Impulse Control:  Fair   Risk Assessment: Danger to Self:  No Self-injurious Behavior: No Danger to Others: No Duty to Warn:no Physical Aggression / Violence:No  Access to Firearms a concern: No  Gang Involvement:No   Subjective: Client reported feeling a little nervous about upcoming jobs. Therapist used MI & SFT with client to support client as she processed her fears of working too much or her family still asking her for too much help doing chores/errands while she works 2 jobs. Therapist and client engaged in roleplay using assertiveness to help her process points she wants to make with her parents. Client reports the trouble she has with doing all these house chores and not being appreciated/thanked. Therapist used family systems and client making progress drawing boundaries with her parents asking her to do everything in the house.   Interventions: Roleplay, Motivational Interviewing, Solution-Oriented/Positive Psychology and Family Systems  Diagnosis:   ICD-10-CM   1. PTSD (post-traumatic stress disorder)  F43.10   2. Generalized anxiety disorder  F41.1   3. Mixed obsessional thoughts and acts  F42.2   4. Bipolar II disorder (HCC)  F31.81     Plan of Care:   Client is to return to therapy with therapist every 1-2 weeks as needed to process pain/grief/traumas in a safe space, to be re-evaluated in 3 months.  Client is to consider seeing a medication provider to explore possible need for medication to help them feel more like themselves. Client is to practice mindfulness AEB daily meditation and body scans or as needed when flooded by emotion/pain/flashbacks or feeling numb.  Client is to learn/practice DBT wise mind & radical acceptance AEB understanding they don't have to live in extremes and can feel both happy and sad emotions, and good and bad memories and allowing themselves to do so.  Client is to practice self-compassion AEB being gentle with themselves, utilizing self-care techniques daily or as needed when grieving something in the moment.  Client is to process grief/pain in a somatic body-felt sense way: ie using mindfulness, brainspotting, or trauma release as a method for releasing body pain/tension caused by grief. Client to step outside of her comfort zone to really be "herself".  Pauline Good, LCSW, LCAS, CCTP, CCS-I, BSP

## 2019-12-18 ENCOUNTER — Ambulatory Visit (INDEPENDENT_AMBULATORY_CARE_PROVIDER_SITE_OTHER): Payer: 59 | Admitting: Addiction (Substance Use Disorder)

## 2019-12-18 ENCOUNTER — Encounter: Payer: Self-pay | Admitting: Addiction (Substance Use Disorder)

## 2019-12-18 ENCOUNTER — Other Ambulatory Visit: Payer: Self-pay

## 2019-12-18 DIAGNOSIS — F331 Major depressive disorder, recurrent, moderate: Secondary | ICD-10-CM

## 2019-12-18 DIAGNOSIS — F4310 Post-traumatic stress disorder, unspecified: Secondary | ICD-10-CM

## 2019-12-18 NOTE — Progress Notes (Signed)
      [facility] Counselor/Therapist Progress Note  Patient ID: Lori Gomez, MRN: [id number],    Date: 12/18/2019  Time Spent: 8:08-9:03 54 mins  Treatment Type: Individual Therapy  Reported Symptoms: flashbacks, nightmares and anxiety.   Mental Status Exam:  Appearance:   Well Groomed     Behavior:  Appropriate  Motor:  Normal  Speech/Language:   Normal Rate  Affect:  Appropriate, Congruent and Full Range  Mood:  anxious  Thought process:  normal  Thought content:    Obsessions and Rumination  Sensory/Perceptual disturbances:    Flashback  Orientation:  x4  Attention:  Good  Concentration:  Fair  Memory:  WNL  Fund of knowledge:   Good  Insight:    Good  Judgment:   Good  Impulse Control:  Fair   Risk Assessment: Danger to Self:  No Self-injurious Behavior: No Danger to Others: No Duty to Warn:no Physical Aggression / Violence:No  Access to Firearms a concern: No  Gang Involvement:No   Subjective: Client reported flashbacks, nightmares and increased anxiety. Therapist used Grief therapy with client as she processed triggers. Client shared how she was not sleeping more than 4 hours at a time, and therapist used MI and Mindfulness to help support client and help instill hope in her ability to have a day where she sleeps a full night. Therapist led client in grounding exercise and body scan to help her recognize her body tension and areas of calm inside. Therapist use resourced bsp with client to help her process the tension and heat and paralyzation in her back and shoulders now and as she wakes each time after a nightmare. Client SUDs were 6/10 and increased to a 9/10 while doing body scan, but following bsp with therapist, client's body relaxed and she reported feeling "chill" and a 0/10 SUDs.   Interventions: Mindfulness Meditation, Motivational Interviewing, Grief Therapy and Brainspotting  Diagnosis:   ICD-10-CM   1. Major depressive disorder, recurrent episode,  moderate (HCC)  F33.1   2. PTSD (post-traumatic stress disorder)  F43.10     Plan of Care:  Client is to return to therapy with therapist every 1-2 weeks as needed to process pain/grief/traumas in a safe space, to be re-evaluated in 3 months.  Client is to consider seeing a medication provider to explore possible need for medication to help them feel more like themselves. Client is to practice mindfulness AEB daily meditation and body scans or as needed when flooded by emotion/pain/flashbacks or feeling numb.  Client is to learn/practice DBT wise mind & radical acceptance AEB understanding they don't have to live in extremes and can feel both happy and sad emotions, and good and bad memories and allowing themselves to do so.  Client is to practice self-compassion AEB being gentle with themselves, utilizing self-care techniques daily or as needed when grieving something in the moment.  Client is to process grief/pain in a somatic body-felt sense way: ie using mindfulness, brainspotting, or trauma release as a method for releasing body pain/tension caused by grief. Client to step outside of her comfort zone to really be "herself".  Pauline Good, LCSW, LCAS, CCTP, CCS-I, BSP

## 2019-12-28 ENCOUNTER — Other Ambulatory Visit: Payer: Self-pay

## 2019-12-28 ENCOUNTER — Encounter: Payer: Self-pay | Admitting: Addiction (Substance Use Disorder)

## 2019-12-28 ENCOUNTER — Ambulatory Visit (INDEPENDENT_AMBULATORY_CARE_PROVIDER_SITE_OTHER): Payer: 59 | Admitting: Addiction (Substance Use Disorder)

## 2019-12-28 DIAGNOSIS — F411 Generalized anxiety disorder: Secondary | ICD-10-CM | POA: Diagnosis not present

## 2019-12-28 DIAGNOSIS — F4310 Post-traumatic stress disorder, unspecified: Secondary | ICD-10-CM | POA: Diagnosis not present

## 2019-12-28 DIAGNOSIS — F331 Major depressive disorder, recurrent, moderate: Secondary | ICD-10-CM | POA: Diagnosis not present
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
## 2020-01-08 ENCOUNTER — Encounter: Payer: Self-pay | Admitting: Addiction (Substance Use Disorder)

## 2020-01-08 ENCOUNTER — Ambulatory Visit (INDEPENDENT_AMBULATORY_CARE_PROVIDER_SITE_OTHER): Payer: 59 | Admitting: Addiction (Substance Use Disorder)

## 2020-01-08 ENCOUNTER — Other Ambulatory Visit: Payer: Self-pay

## 2020-01-08 DIAGNOSIS — F4310 Post-traumatic stress disorder, unspecified: Secondary | ICD-10-CM

## 2020-01-08 DIAGNOSIS — F411 Generalized anxiety disorder: Secondary | ICD-10-CM

## 2020-01-08 NOTE — Progress Notes (Signed)
[facility] Counselor/Therapist Progress Note  Patient ID: SAIRA KRAMME, MRN: [id number],    Date: 01/08/2020  Time Spent: 10:13-11:00 47 mins  Treatment Type: Individual Therapy  Reported Symptoms: panic attacks, feelings of doom, consistent anxiety, irritability.    Mental Status Exam:  Appearance:   Casual     Behavior:  Appropriate  Motor:  Normal  Speech/Language:   Normal Rate  Affect:  Appropriate, Congruent and Full Range  Mood:  depressed and dysthymic  Thought process:  normal  Thought content:    Obsessions and Rumination  Sensory/Perceptual disturbances:    Flashback  Orientation:  x4  Attention:  Good  Concentration:  Fair  Memory:  WNL  Fund of knowledge:   Good  Insight:    Good  Judgment:   Good  Impulse Control:  Fair   Risk Assessment: Danger to Self:  No Self-injurious Behavior: No Danger to Others: No Duty to Warn:no Physical Aggression / Violence:No  Access to Firearms a concern: No  Gang Involvement:No   Subjective: Client reported not feeling okay feeling what shes feeling: because she has to justify how shes feeling and what she does moment by moment to her mom. Client then overthinks and ruminates, critiques herself about what she should be doing. Therapist used MI and CBT to support client and help her challenge the ruminations. Client struggling with feeling sad, panic attacks, feelings of doom, consistent anxiety, irritability. Client shared about how her aunt dying a few weeks ago increased her anxiety and grief overall, making her avoid making close relationships and be alone. Therapist used Grief therapy and fftt trauma therapy and CBT, to help client grieve and find ways to feel her sadness and heal from it. Client shared of her complicated losses that cause a struggle with anxious ruminations now about losing her grandma or others close to her. Client recognized trying to avoid feeling anything, in fear that she'll feel too much and not  be able to endure it. Therapist used mindfulness with client to help teach her how to ground to calm the overwhelming feelings and MI to validate for the client the trigger of not wanting to feel anything. Therapist assessed for safety and sobriety and client denied SI/HI/AVH but reported increased smoking of THC to deal with anxiety. Therapist encouraged harm reduction and tapering when possible.   Interventions: Cognitive Behavioral Therapy, Motivational Interviewing, Grief Therapy and fftt trauma therapy & Harm Reduction.   Diagnosis:   ICD-10-CM   1. PTSD (post-traumatic stress disorder)  F43.10   2. Generalized anxiety disorder  F41.1     Plan of Care:  Client is to return to therapy with therapist every 1-2 weeks as needed to process pain/grief/traumas in a safe space, to be re-evaluated in 3 months.  Client is to consider seeing a medication provider to explore possible need for medication to help them feel more like themselves. Client is to practice mindfulness AEB daily meditation and body scans or as needed when flooded by emotion/pain/flashbacks or feeling numb.  Client is to learn/practice DBT wise mind & radical acceptance AEB understanding they don't have to live in extremes and can feel both happy and sad emotions, and good and bad memories and allowing themselves to do so.  Client is to practice self-compassion AEB being gentle with themselves, utilizing self-care techniques daily or as needed when grieving something in the moment.  Client is to process grief/pain in a somatic body-felt sense way: ie using mindfulness, brainspotting,  or trauma release as a method for releasing body pain/tension caused by grief. Client to step outside of her comfort zone to really be "herself".  Pauline Good, LCSW, LCAS, CCTP, CCS-I, BSP

## 2020-01-21 ENCOUNTER — Ambulatory Visit: Payer: 59 | Admitting: Addiction (Substance Use Disorder)

## 2020-01-29 ENCOUNTER — Ambulatory Visit: Payer: 59 | Admitting: Addiction (Substance Use Disorder)

## 2020-02-05 ENCOUNTER — Ambulatory Visit: Payer: 59 | Admitting: Addiction (Substance Use Disorder)

## 2020-02-12 ENCOUNTER — Ambulatory Visit: Payer: 59 | Admitting: Addiction (Substance Use Disorder)

## 2020-02-19 ENCOUNTER — Other Ambulatory Visit: Payer: Self-pay

## 2020-02-19 ENCOUNTER — Encounter: Payer: Self-pay | Admitting: Addiction (Substance Use Disorder)

## 2020-02-19 ENCOUNTER — Ambulatory Visit (INDEPENDENT_AMBULATORY_CARE_PROVIDER_SITE_OTHER): Payer: 59 | Admitting: Addiction (Substance Use Disorder)

## 2020-02-19 DIAGNOSIS — F4310 Post-traumatic stress disorder, unspecified: Secondary | ICD-10-CM | POA: Diagnosis not present
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
## 2020-03-04 ENCOUNTER — Encounter: Payer: Self-pay | Admitting: Addiction (Substance Use Disorder)

## 2020-03-04 ENCOUNTER — Other Ambulatory Visit: Payer: Self-pay

## 2020-03-04 ENCOUNTER — Ambulatory Visit (INDEPENDENT_AMBULATORY_CARE_PROVIDER_SITE_OTHER): Payer: 59 | Admitting: Addiction (Substance Use Disorder)

## 2020-03-04 DIAGNOSIS — F4310 Post-traumatic stress disorder, unspecified: Secondary | ICD-10-CM

## 2020-03-04 NOTE — Progress Notes (Signed)
      [facility] Counselor/Therapist Progress Note  Patient ID: GISELA LEA, MRN: [id number],    Date: 03/04/2020  Time Spent:   Treatment Type: Individual Therapy  Reported Symptoms: felt violated, anxious, upset.  Mental Status Exam:  Appearance:   Casual and Well Groomed     Behavior:  Appropriate  Motor:  Normal  Speech/Language:   Normal Rate  Affect:  Appropriate, Congruent and Full Range  Mood:  sad  Thought process:  normal  Thought content:    Obsessions and Rumination  Sensory/Perceptual disturbances:    Flashback  Orientation:  x4  Attention:  Good  Concentration:  Fair  Memory:  WNL  Fund of knowledge:   Good  Insight:    Good  Judgment:   Good  Impulse Control:  Fair   Risk Assessment: Danger to Self:  No Self-injurious Behavior: No Danger to Others: No Duty to Warn:no Physical Aggression / Violence:No  Access to Firearms a concern: No  Gang Involvement:No   Subjective: Client came in reporting concern/anxiety in her body. She shared that she is having to go through her childhood memories such as old childhood pictures. Client reported feeling scared of not feeling much at all other than some sadness & numbness when she looks the pictures, until she sticks with it long enough and then comes the anxiety. Client identified feeling the anxiety/tingly in her stomach and neck and reporting feeling like that little girl was gentle, weak, and alone. Client also identified feeling that the little girl was strong and felt that in her heart. Therapist used BSP and grief therapy with client to support her in processing the strength that she as a young girl had, until the anxiety reduced and she felt some relief to feel her warm strength in her heart. Therapist assessed for safety and sobriety and client denied SI/HI/AVH but reported continued smoking.   Interventions: Cognitive Behavioral Therapy, Motivational Interviewing, Grief Therapy and Brainspotting    Diagnosis:   ICD-10-CM   1. PTSD (post-traumatic stress disorder)  F43.10     Plan of Care:  Client is to return to therapy with therapist every 1-2 weeks as needed to process pain/grief/traumas in a safe space, to be re-evaluated in 3 months.  Client is to consider seeing a medication provider to explore possible need for medication to help them feel more like themselves. Client is to practice mindfulness AEB daily meditation and body scans or as needed when flooded by emotion/pain/flashbacks or feeling numb.  Client is to learn/practice DBT wise mind & radical acceptance AEB understanding they don't have to live in extremes and can feel both happy and sad emotions, and good and bad memories and allowing themselves to do so.  Client is to practice self-compassion AEB being gentle with themselves, utilizing self-care techniques daily or as needed when grieving something in the moment.  Client is to process grief/pain in a somatic body-felt sense way: ie using mindfulness, brainspotting, or trauma release as a method for releasing body pain/tension caused by grief. Client to step outside of her comfort zone to really be "herself".  Pauline Good, LCSW, LCAS, CCTP, CCS-I, BSP

## 2020-03-17 ENCOUNTER — Other Ambulatory Visit: Payer: Self-pay

## 2020-03-17 ENCOUNTER — Ambulatory Visit (INDEPENDENT_AMBULATORY_CARE_PROVIDER_SITE_OTHER): Payer: 59 | Admitting: Addiction (Substance Use Disorder)

## 2020-03-17 ENCOUNTER — Encounter: Payer: Self-pay | Admitting: Addiction (Substance Use Disorder)

## 2020-03-17 DIAGNOSIS — F411 Generalized anxiety disorder: Secondary | ICD-10-CM | POA: Diagnosis not present

## 2020-03-17 DIAGNOSIS — F4323 Adjustment disorder with mixed anxiety and depressed mood: Secondary | ICD-10-CM

## 2020-03-17 NOTE — Progress Notes (Signed)
      [facility] Counselor/Therapist Progress Note  Patient ID: Lori Gomez, MRN: [id number],    Date: 03/17/2020  Time Spent:  Treatment Type: Individual Therapy  Reported Symptoms: tired, feeling worn out.   Mental Status Exam:  Appearance:   Casual and Well Groomed     Behavior:  Appropriate  Motor:  Normal  Speech/Language:   Normal Rate  Affect:  Appropriate, Congruent and Full Range  Mood:  normal  Thought process:  normal  Thought content:    Obsessions and Rumination  Sensory/Perceptual disturbances:    Flashback  Orientation:  x4  Attention:  Good  Concentration:  Fair  Memory:  WNL  Fund of knowledge:   Good  Insight:    Good  Judgment:   Good  Impulse Control:  Fair   Risk Assessment: Danger to Self:  No Self-injurious Behavior: No Danger to Others: No Duty to Warn:no Physical Aggression / Violence:No  Access to Firearms a concern: No  Gang Involvement:No   Subjective: Client came in reporting feeling exhausted already this semester after starting school bac & still trying to work 15-20 hours. Client reported moving to an apartment and feeling good being away from her mom, but also her mom has more expectations for her that are unrealistic. Therapist used MI & CBT to support her and validate her frustration and validate the rational thoughts that frustrate her. Client started school back this semester and feeling happy about the classes shes taking such as drug interrogations. Client thinking about what kind of job to use her degree with. 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 Therapist assessed for safety and sobriety and client denied SI/HI/AVH but reported continued smoking.   Interventions: Cognitive Behavioral Therapy and Motivational Interviewing   Diagnosis:   ICD-10-CM   1. Generalized anxiety disorder  F41.1   2. Adjustment disorder with mixed anxiety and depressed mood  F43.23     Plan of Care:  Client is to return to therapy with therapist every 1-2 weeks as needed to process pain/grief/traumas in a safe space, to be re-evaluated in 3 months.  Client is to consider seeing a medication provider to explore possible need for medication to help them feel more like themselves. Client is to practice mindfulness AEB daily meditation and body scans or as needed when flooded by emotion/pain/flashbacks or feeling numb.  Client is to learn/practice DBT wise mind & radical acceptance AEB understanding they don't have to live in extremes and can feel both happy and sad emotions, and good and bad memories and allowing themselves to do so.  Client is to practice self-compassion AEB being gentle with themselves, utilizing self-care techniques daily or as needed when grieving something in the moment.  Client is to process grief/pain in a somatic body-felt sense way: ie using mindfulness, brainspotting, or trauma release as a method for releasing body pain/tension caused by grief. Client to step outside of her comfort zone to really be "herself".  Pauline Good, LCSW, LCAS, CCTP, CCS-I, BSP

## 2020-03-30 ENCOUNTER — Ambulatory Visit: Payer: 59 | Admitting: Addiction (Substance Use Disorder)

## 2020-04-08 ENCOUNTER — Other Ambulatory Visit: Payer: Self-pay

## 2020-04-08 ENCOUNTER — Encounter: Payer: Self-pay | Admitting: Addiction (Substance Use Disorder)

## 2020-04-08 ENCOUNTER — Ambulatory Visit (INDEPENDENT_AMBULATORY_CARE_PROVIDER_SITE_OTHER): Payer: 59 | Admitting: Addiction (Substance Use Disorder)

## 2020-04-08 DIAGNOSIS — F411 Generalized anxiety disorder: Secondary | ICD-10-CM
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
## 2020-04-15 ENCOUNTER — Encounter: Payer: Self-pay | Admitting: Addiction (Substance Use Disorder)

## 2020-04-15 ENCOUNTER — Ambulatory Visit (INDEPENDENT_AMBULATORY_CARE_PROVIDER_SITE_OTHER): Payer: 59 | Admitting: Addiction (Substance Use Disorder)

## 2020-04-15 ENCOUNTER — Other Ambulatory Visit: Payer: Self-pay

## 2020-04-15 DIAGNOSIS — F411 Generalized anxiety disorder: Secondary | ICD-10-CM

## 2020-04-15 NOTE — Progress Notes (Signed)
      [facility] Counselor/Therapist Progress Note  Patient ID: Lori Gomez, MRN: [id number],    Date: 04/15/2020  Time Spent:  Treatment Type: Individual Therapy  Reported Symptoms: frustrated.  Mental Status Exam:  Appearance:   Casual     Behavior:  Appropriate  Motor:  Normal  Speech/Language:   Normal Rate  Affect:  Appropriate, Congruent and Full Range  Mood:  irritable  Thought process:  normal  Thought content:    Rumination  Sensory/Perceptual disturbances:    Flashback  Orientation:  x4  Attention:  Good  Concentration:  Fair  Memory:  WNL  Fund of knowledge:   Good  Insight:    Good  Judgment:   Good  Impulse Control:  Fair   Risk Assessment: Danger to Self:  No Self-injurious Behavior: No Danger to Others: No Duty to Warn:no Physical Aggression / Violence:No  Access to Firearms a concern: No  Gang Involvement:No   Subjective: Client reported frustration and feeling on edge with everyone lately, due to "people needing too much from her lately." Client described over-giving herself to the needs of others and therapist used MI & CBT with client to support her, validate her frustration, while also encouraging client to challenge her thoughts of "being bad if she doesn't help others that ask". Therapist also used mindfulness to help client feel more grounded when thinking of the guilt she would feel if she said no/drew a boundary to care for herself. Client reported having a hard time with being controlled by  Her guilt. Therapist assessed for risk and client denied SI/HI/AVH.  Interventions: Cognitive Behavioral Therapy and Motivational Interviewing   Diagnosis:   ICD-10-CM   1. Generalized anxiety disorder  F41.1     Plan of Care:  Client is to return to therapy with therapist every 1-2 weeks as needed to process pain/grief/traumas in a safe space, to be re-evaluated in 3 months.  Client is to consider seeing a medication provider to explore  possible need for medication to help them feel more like themselves. Client is to practice mindfulness AEB daily meditation and body scans or as needed when flooded by emotion/pain/flashbacks or feeling numb.  Client is to learn/practice DBT wise mind & radical acceptance AEB understanding they don't have to live in extremes and can feel both happy and sad emotions, and good and bad memories and allowing themselves to do so.  Client is to practice self-compassion AEB being gentle with themselves, utilizing self-care techniques daily or as needed when grieving something in the moment.  Client is to process grief/pain in a somatic body-felt sense way: ie using mindfulness, brainspotting, or trauma release as a method for releasing body pain/tension caused by grief. Client to step outside of her comfort zone to really be "herself".  Pauline Good, LCSW, LCAS, CCTP, CCS-I, BSP

## 2020-04-28 ENCOUNTER — Encounter: Payer: Self-pay | Admitting: Addiction (Substance Use Disorder)

## 2020-04-28 ENCOUNTER — Ambulatory Visit (INDEPENDENT_AMBULATORY_CARE_PROVIDER_SITE_OTHER): Payer: 59 | Admitting: Addiction (Substance Use Disorder)

## 2020-04-28 DIAGNOSIS — F411 Generalized anxiety disorder: Secondary | ICD-10-CM

## 2020-04-28 DIAGNOSIS — F4310 Post-traumatic stress disorder, unspecified: Secondary | ICD-10-CM | POA: Diagnosis not present
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
## 2020-04-29 ENCOUNTER — Ambulatory Visit: Payer: 59 | Admitting: Addiction (Substance Use Disorder)

## 2020-05-11 ENCOUNTER — Ambulatory Visit (INDEPENDENT_AMBULATORY_CARE_PROVIDER_SITE_OTHER): Payer: 59 | Admitting: Addiction (Substance Use Disorder)

## 2020-05-11 ENCOUNTER — Other Ambulatory Visit: Payer: Self-pay

## 2020-05-11 DIAGNOSIS — F4310 Post-traumatic stress disorder, unspecified: Secondary | ICD-10-CM | POA: Diagnosis not present

## 2020-05-11 DIAGNOSIS — F4323 Adjustment disorder with mixed anxiety and depressed mood: Secondary | ICD-10-CM
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
## 2020-05-20 ENCOUNTER — Other Ambulatory Visit: Payer: Self-pay

## 2020-05-20 ENCOUNTER — Encounter: Payer: Self-pay | Admitting: Addiction (Substance Use Disorder)

## 2020-05-20 ENCOUNTER — Ambulatory Visit (INDEPENDENT_AMBULATORY_CARE_PROVIDER_SITE_OTHER): Payer: 59 | Admitting: Addiction (Substance Use Disorder)

## 2020-05-20 DIAGNOSIS — F4310 Post-traumatic stress disorder, unspecified: Secondary | ICD-10-CM
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
## 2020-05-26 ENCOUNTER — Other Ambulatory Visit (HOSPITAL_COMMUNITY): Payer: Self-pay

## 2020-05-26 DIAGNOSIS — J029 Acute pharyngitis, unspecified: Secondary | ICD-10-CM | POA: Diagnosis not present

## 2020-05-26 MED FILL — PENICILLIN VK 500 MG TABLET: 500 | 10 days supply | Qty: 20 | Fill #0

## 2020-05-26 MED FILL — FLUCONAZOLE 150 MG TABS: 150 | 2 days supply | Qty: 2 | Fill #0

## 2020-05-26 MED FILL — DEXAMETHASONE 4 MG TABLET: 4 | 1 days supply | Qty: 2 | Fill #0

## 2020-05-30 ENCOUNTER — Ambulatory Visit (INDEPENDENT_AMBULATORY_CARE_PROVIDER_SITE_OTHER): Payer: 59 | Admitting: Addiction (Substance Use Disorder)

## 2020-05-30 ENCOUNTER — Encounter: Payer: Self-pay | Admitting: Addiction (Substance Use Disorder)

## 2020-05-30 DIAGNOSIS — F4310 Post-traumatic stress disorder, unspecified: Secondary | ICD-10-CM | POA: Diagnosis not present

## 2020-05-30 NOTE — Progress Notes (Signed)
[facility] Counselor/Therapist Progress Note  Patient ID: Lori Gomez, MRN: [id number],    Date: 05/30/2020  Time Spent:  Treatment Type: Individual Therapy  Reported Symptoms: sad  Mental Status Exam:  Appearance:   Casual     Behavior:  Appropriate  Motor:  Normal  Speech/Language:   Clear and Coherent and Normal Rate  Affect:  Congruent and Full Range  Mood:  sad  Thought process:  normal  Thought content:    Paranoid Ideation and Rumination  Sensory/Perceptual disturbances:    Flashbacks  Orientation:  x4  Attention:  Good  Concentration:  Fair  Memory:  WNL  Fund of knowledge:   Good  Insight:    Good  Judgment:   Good  Impulse Control:  Fair   Risk Assessment: Danger to Self:  No Self-injurious Behavior: No Danger to Others: No Duty to Warn:no Physical Aggression / Violence:No  Access to Firearms a concern: No  Gang Involvement:No   Virtual Visit via VIDEO: I connected with client by MyChart video enabled telemedicine/telehealth application, with their informed consent, and verified client privacy and that I am speaking with the correct person using two identifiers. I discussed the limitations, risks, security and privacy concerns of performing psychotherapy and management service virtually and confirmed their location. I also discussed with the patient that there may be a patient responsible charge related to this service and to confirm with the front desk if their insurance covers teletherapy. I also discussed with the patient the availability of in person appointments. The patient expressed understanding and agreed to proceed. I discussed the treatment planning with the client. The client was provided an opportunity to ask questions and all were answered. The client agreed with the plan and demonstrated an understanding of the instructions. The client was advised to call our office if symptoms worsen or feel they are in a crisis state and need  immediate contact. Client also reminded of a crisis line number and to use 9-1-1 if there's an emergency.  Therapist Location: office; Client Location: home.  Subjective: Client reported feeling sad and surprised by the grief she feels after losing her grandpa, who she just started rebuilding a relationship with recently, after years of absenteeism. Client feeling like she cant grieve without life passing her by, including getting behind on schoolwork and frustrated she doesn't understand some concepts. Client feeling poor about her abilities and therapist used MI to support client and provide affirmation about her abilities and hard work ethic. Therapist also used grief therapy to guide client and normalize her feelings. Therapist assessed for risk and client denied SI/HI/AVH and reported continued THC but slow reduction.  Interventions: Motivational Interviewing, Grief Therapy and Harm Reduction   Diagnosis:   ICD-10-CM   1. PTSD (post-traumatic stress disorder)  F43.10     Plan of Care:  Client is to return to therapy with therapist every 1-2 weeks as needed to process pain/grief/traumas in a safe space, to be re-evaluated in 3 months.  Client is to consider seeing a medication provider to explore possible need for medication to help them feel more like themselves. Client is to practice mindfulness AEB daily meditation and body scans or as needed when flooded by emotion/pain/flashbacks or feeling numb.  Client is to learn/practice DBT wise mind & radical acceptance AEB understanding they don't have to live in extremes and can feel both happy and sad emotions, and good and bad memories and allowing themselves to do so.  Client is to practice self-compassion AEB being gentle with themselves, utilizing self-care techniques daily or as needed when grieving something in the moment.  Client is to process grief/pain in a somatic body-felt sense way: ie using mindfulness, brainspotting, or trauma release  as a method for releasing body pain/tension caused by grief. Client to step outside of her comfort zone to really be "herself".  Pauline Good, LCSW, LCAS, CCTP, CCS-I, BSP

## 2020-06-15 ENCOUNTER — Other Ambulatory Visit (HOSPITAL_COMMUNITY): Payer: Self-pay

## 2020-06-15 DIAGNOSIS — N898 Other specified noninflammatory disorders of vagina: Secondary | ICD-10-CM | POA: Diagnosis not present

## 2020-06-15 MED FILL — metroNIDAZOLE 500 MG TABS: 500 | 7 days supply | Qty: 14 | Fill #0

## 2020-06-23 ENCOUNTER — Ambulatory Visit: Payer: 59 | Admitting: Addiction (Substance Use Disorder)

## 2020-07-05 ENCOUNTER — Ambulatory Visit (INDEPENDENT_AMBULATORY_CARE_PROVIDER_SITE_OTHER): Payer: 59 | Admitting: Addiction (Substance Use Disorder)

## 2020-07-05 ENCOUNTER — Telehealth: Payer: Self-pay | Admitting: Addiction (Substance Use Disorder)

## 2020-07-05 DIAGNOSIS — F4310 Post-traumatic stress disorder, unspecified: Secondary | ICD-10-CM | POA: Diagnosis not present

## 2020-07-05 NOTE — Telephone Encounter (Signed)
Error

## 2020-07-05 NOTE — Progress Notes (Signed)
[facility] Counselor/Therapist Progress Note  Patient ID: Lori Gomez, MRN: [id number],    Date: 07/05/2020  Time Spent:  Treatment Type: Individual Therapy  Reported Symptoms: happier, more calm.  Mental Status Exam:  Appearance:   Casual     Behavior:  Appropriate  Motor:  Normal  Speech/Language:   Clear and Coherent and Normal Rate  Affect:  Congruent and Full Range  Mood:  normal  Thought process:  normal  Thought content:    Rumination  Sensory/Perceptual disturbances:    Flashbacks  Orientation:  x4  Attention:  Good  Concentration:  Fair  Memory:  WNL  Fund of knowledge:   Good  Insight:    Good  Judgment:   Good  Impulse Control:  Fair   Risk Assessment: Danger to Self:  No Self-injurious Behavior: No Danger to Others: No Duty to Warn:no Physical Aggression / Violence:No  Access to Firearms a concern: No  Gang Involvement:No   Virtual Visit via VIDEO: I connected with client by MyChart video enabled telemedicine/telehealth application, with their informed consent, and verified client privacy and that I am speaking with the correct person using two identifiers. I discussed the limitations, risks, security and privacy concerns of performing psychotherapy and management service virtually and confirmed their location. I also discussed with the patient that there may be a patient responsible charge related to this service and to confirm with the front desk if their insurance covers teletherapy. I also discussed with the patient the availability of in person appointments. The patient expressed understanding and agreed to proceed. I discussed the treatment planning with the client. The client was provided an opportunity to ask questions and all were answered. The client agreed with the plan and demonstrated an understanding of the instructions. The client was advised to call our office if symptoms worsen or feel they are in a crisis state and need immediate  contact. Client also reminded of a crisis line number and to use 9-1-1 if there's an emergency.  Therapist Location: office; Client Location: home.  Subjective: Client reported feeling happier and more calm since the changes she made in her life after the epiphany she had about her life. Client processed how she became motivated to stop smoking THC because she became less interested in doing well in her life. Client regained her motivation for her life, her career, and finding her way. Client processed the drama from her roommate who tries to put her down and tell her what to do. Therapist used MI & assertiveness to support client and encouraged her to continue making steps forward and use assertiveness to coach client for interactions with her toxic roommate. Therapist assessed for risk and client denied SI/HI/AVH and reported no more smoking weed.   Interventions: Assertiveness/Communication, Motivational Interviewing and Harm Reduction   Diagnosis:   ICD-10-CM   1. PTSD (post-traumatic stress disorder)  F43.10     Plan of Care:  Client is to return to therapy with therapist every 1-2 weeks as needed to process pain/grief/traumas in a safe space, to be re-evaluated in 3 months.  Client is to consider seeing a medication provider to explore possible need for medication to help them feel more like themselves. Client is to practice mindfulness AEB daily meditation and body scans or as needed when flooded by emotion/pain/flashbacks or feeling numb.  Client is to learn/practice DBT wise mind & radical acceptance AEB understanding they don't have to live in extremes and can feel both  happy and sad emotions, and good and bad memories and allowing themselves to do so.  Client is to practice self-compassion AEB being gentle with themselves, utilizing self-care techniques daily or as needed when grieving something in the moment.  Client is to process grief/pain in a somatic body-felt sense way: ie using  mindfulness, brainspotting, or trauma release as a method for releasing body pain/tension caused by grief. Client to step outside of her comfort zone to really be "herself".  Pauline Good, LCSW, LCAS, CCTP, CCS-I, BSP

## 2020-07-12 ENCOUNTER — Other Ambulatory Visit (HOSPITAL_COMMUNITY): Payer: Self-pay | Admitting: Radiology

## 2020-07-12 DIAGNOSIS — Z113 Encounter for screening for infections with a predominantly sexual mode of transmission: Secondary | ICD-10-CM | POA: Diagnosis not present

## 2020-07-12 DIAGNOSIS — B373 Candidiasis of vulva and vagina: Secondary | ICD-10-CM | POA: Diagnosis not present

## 2020-07-12 DIAGNOSIS — N898 Other specified noninflammatory disorders of vagina: Secondary | ICD-10-CM | POA: Diagnosis not present

## 2020-07-12 MED FILL — FLUCONAZOLE 150 MG TABS: 150 | 2 days supply | Qty: 2 | Fill #0

## 2020-07-20 ENCOUNTER — Encounter: Payer: Self-pay | Admitting: Addiction (Substance Use Disorder)

## 2020-07-20 ENCOUNTER — Ambulatory Visit (INDEPENDENT_AMBULATORY_CARE_PROVIDER_SITE_OTHER): Payer: 59 | Admitting: Addiction (Substance Use Disorder)

## 2020-07-20 DIAGNOSIS — F411 Generalized anxiety disorder: Secondary | ICD-10-CM

## 2020-07-20 DIAGNOSIS — F4310 Post-traumatic stress disorder, unspecified: Secondary | ICD-10-CM

## 2020-07-20 NOTE — Progress Notes (Signed)
[facility] Counselor/Therapist Progress Note  Patient ID: Lori Gomez, MRN: [id number],    Date: 07/20/2020  Time Spent:  Treatment Type: Individual Therapy  Reported Symptoms: nightmares, frustrated with roommate.  Mental Status Exam:  Appearance:   Casual     Behavior:  Appropriate  Motor:  Normal  Speech/Language:   Clear and Coherent and Normal Rate  Affect:  Congruent and Full Range  Mood:  anxious  Thought process:  normal  Thought content:    Rumination  Sensory/Perceptual disturbances:    Flashbacks & nighmares  Orientation:  x4  Attention:  Good  Concentration:  Fair  Memory:  WNL  Fund of knowledge:   Good  Insight:    Good  Judgment:   Good  Impulse Control:  Fair   Risk Assessment: Danger to Self:  No Self-injurious Behavior: No Danger to Others: No Duty to Warn:no Physical Aggression / Violence:No  Access to Firearms a concern: No  Gang Involvement:No   Virtual Visit via VIDEO: I connected with client by MyChart video enabled telemedicine/telehealth application, with their informed consent, and verified client privacy and that I am speaking with the correct person using two identifiers. I discussed the limitations, risks, security and privacy concerns of performing psychotherapy and management service virtually and confirmed their location. I also discussed with the patient that there may be a patient responsible charge related to this service and to confirm with the front desk if their insurance covers teletherapy. I also discussed with the patient the availability of in person appointments. The patient expressed understanding and agreed to proceed. I discussed the treatment planning with the client. The client was provided an opportunity to ask questions and all were answered. The client agreed with the plan and demonstrated an understanding of the instructions. The client was advised to call our office if symptoms worsen or feel they are in a  crisis state and need immediate contact. Client also reminded of a crisis line number and to use 9-1-1 if there's an emergency.  Therapist Location: office; Client Location: home.  Subjective: Client reported doing well overall but having some ongoing issues with her roommate whos trying to annoy her for attention. Client making meaning of her roommate most likely having borderline personality disorder. Client looking forward to moving and coping with stressors but dealing with anxiety as a result of it all. Client reported that stress increasing her nightmares and considering a medication to help with them. Therapist used MI & RPT with client to support her in processing her stress and inquire about client's motivation for staying sober. Therapist assessed for safety and sobriety and client denied SI/HI/AVH. Client also processed her continued abstinence from [facility] and her improvement as a result with her clarity of mind and memory.   Interventions: Motivational Interviewing and RPT   Diagnosis:   ICD-10-CM   1. PTSD (post-traumatic stress disorder)  F43.10   2. Generalized anxiety disorder  F41.1      Plan of Care:  Client is to return to therapy with therapist every 1-2 weeks as needed to process pain/grief/traumas in a safe space, to be re-evaluated in 3 months.  Client is to consider seeing a medication provider to explore possible need for medication to help them feel more like themselves. Client is to practice mindfulness AEB daily meditation and body scans or as needed when flooded by emotion/pain/flashbacks or feeling numb.  Client is to learn/practice DBT wise mind & radical acceptance AEB understanding  they don't have to live in extremes and can feel both happy and sad emotions, and good and bad memories and allowing themselves to do so.  Client is to practice self-compassion AEB being gentle with themselves, utilizing self-care techniques daily or as needed when grieving something in the  moment.  Client is to process grief/pain in a somatic body-felt sense way: ie using mindfulness, brainspotting, or trauma release as a method for releasing body pain/tension caused by grief. Client to step outside of her comfort zone to really be "herself".  Pauline Good, LCSW, LCAS, CCTP, CCS-I, BSP

## 2020-08-02 ENCOUNTER — Telehealth: Payer: Self-pay | Admitting: Addiction (Substance Use Disorder)

## 2020-08-02 NOTE — Telephone Encounter (Signed)
[facility] and Su Hilt called to check the status of

## 2020-08-18 ENCOUNTER — Ambulatory Visit (INDEPENDENT_AMBULATORY_CARE_PROVIDER_SITE_OTHER): Payer: 59 | Admitting: Addiction (Substance Use Disorder)

## 2020-08-18 DIAGNOSIS — F411 Generalized anxiety disorder: Secondary | ICD-10-CM

## 2020-08-18 DIAGNOSIS — F4310 Post-traumatic stress disorder, unspecified: Secondary | ICD-10-CM | POA: Diagnosis not present

## 2020-08-18 NOTE — Progress Notes (Signed)
[facility] Counselor/Therapist Progress Note  Patient ID: Lori Gomez, MRN: [id number],    Date: 08/18/2020  Time Spent:  Treatment Type: Individual Therapy  Reported Symptoms: overstimulated/anxious.   Mental Status Exam:  Appearance:   Casual     Behavior:  Appropriate  Motor:  Normal  Speech/Language:   Clear and Coherent and Normal Rate  Affect:  Congruent and Full Range  Mood:  anxious  Thought process:  normal  Thought content:    Rumination  Sensory/Perceptual disturbances:    Flashbacks   Orientation:  x4  Attention:  Good  Concentration:  Fair  Memory:  WNL  Fund of knowledge:   Good  Insight:    Good  Judgment:   Good  Impulse Control:  Fair   Risk Assessment: Danger to Self:  No Self-injurious Behavior: No Danger to Others: No Duty to Warn:no Physical Aggression / Violence:No  Access to Firearms a concern: No  Gang Involvement:No   Virtual Visit via VIDEO: I connected with client by MyChart video enabled telemedicine/telehealth application, with their informed consent, and verified client privacy and that I am speaking with the correct person using two identifiers. I discussed the limitations, risks, security and privacy concerns of performing psychotherapy and management service virtually and confirmed their location. I also discussed with the patient that there may be a patient responsible charge related to this service and to confirm with the front desk if their insurance covers teletherapy. I also discussed with the patient the availability of in person appointments. The patient expressed understanding and agreed to proceed. I discussed the treatment planning with the client. The client was provided an opportunity to ask questions and all were answered. The client agreed with the plan and demonstrated an understanding of the instructions. The client was advised to call our office if symptoms worsen or feel they are in a crisis state and need  immediate contact. Client also reminded of a crisis line number and to use 9-1-1 if there's an emergency.  Therapist Location: home; Client Location: home.  Subjective: Client reported having struggles with being overstimulated a lot lately in school and at work. Client feeling out of control when this happens and gets embarrassed. Therapist used MI to support client and normalize her struggle with overstimulation and used DBT to help client emotionally regulate to help her ground in the moment and keep from having a panic attack. Client processed about how this affects her ability to not smoke daily as a way to cope. Therapist used RPT with client to encourage the use of other coping skills to reduce her smoking. Therapist assessed for safety and sobriety and client denied SI/HI/AVH. Client reported some increased THC use but keeping it from happening daily.   Interventions: Dialectical Behavioral Therapy, Motivational Interviewing and RPT   Diagnosis:   ICD-10-CM   1. PTSD (post-traumatic stress disorder)  F43.10   2. Generalized anxiety disorder  F41.1      Plan of Care:  Client is to return to therapy with therapist every 1-2 weeks as needed to process pain/grief/traumas in a safe space, to be re-evaluated in 3 months.  Client is to consider seeing a medication provider to explore possible need for medication to help them feel more like themselves. Client is to practice mindfulness AEB daily meditation and body scans or as needed when flooded by emotion/pain/flashbacks or feeling numb.  Client is to learn/practice DBT wise mind & radical acceptance AEB understanding they don't have  to live in extremes and can feel both happy and sad emotions, and good and bad memories and allowing themselves to do so.  Client is to practice self-compassion AEB being gentle with themselves, utilizing self-care techniques daily or as needed when grieving something in the moment.  Client is to process grief/pain  in a somatic body-felt sense way: ie using mindfulness, brainspotting, or trauma release as a method for releasing body pain/tension caused by grief. Client to step outside of her comfort zone to really be "herself".  Pauline Good, LCSW, LCAS, CCTP, CCS-I, BSP

## 2020-08-31 ENCOUNTER — Ambulatory Visit (INDEPENDENT_AMBULATORY_CARE_PROVIDER_SITE_OTHER): Payer: 59 | Admitting: Addiction (Substance Use Disorder)

## 2020-08-31 DIAGNOSIS — F4310 Post-traumatic stress disorder, unspecified: Secondary | ICD-10-CM

## 2020-08-31 DIAGNOSIS — Z6822 Body mass index (BMI) 22.0-22.9, adult: Secondary | ICD-10-CM | POA: Diagnosis not present

## 2020-08-31 DIAGNOSIS — Z01419 Encounter for gynecological examination (general) (routine) without abnormal findings: Secondary | ICD-10-CM | POA: Diagnosis not present

## 2020-08-31 NOTE — Progress Notes (Signed)
[facility] Counselor/Therapist Progress Note  Patient ID: Lori Gomez, MRN: [id number],    Date: 08/31/2020  Time Spent:  Treatment Type: Individual Therapy  Reported Symptoms: disassociated, insomnia self-sabotage.   Mental Status Exam:  Appearance:   Casual     Behavior:  Appropriate  Motor:  Normal  Speech/Language:   Clear and Coherent and Normal Rate  Affect:  Congruent and Full Range  Mood:  anxious and depressed  Thought process:  normal  Thought content:    Rumination  Sensory/Perceptual disturbances:    Flashbacks   Orientation:  x4  Attention:  Good  Concentration:  Fair  Memory:  WNL  Fund of knowledge:   Good  Insight:    Good  Judgment:   Fair  Impulse Control:  Fair   Risk Assessment: Danger to Self:  No Self-injurious Behavior: No Danger to Others: No Duty to Warn:no Physical Aggression / Violence:No  Access to Firearms a concern: No  Gang Involvement:No   Virtual Visit via VIDEO: I connected with client by MyChart video enabled telemedicine/telehealth application, with their informed consent, and verified client privacy and that I am speaking with the correct person using two identifiers. I discussed the limitations, risks, security and privacy concerns of performing psychotherapy and management service virtually and confirmed their location. I also discussed with the patient that there may be a patient responsible charge related to this service and to confirm with the front desk if their insurance covers teletherapy. I also discussed with the patient the availability of in person appointments. The patient expressed understanding and agreed to proceed. I discussed the treatment planning with the client. The client was provided an opportunity to ask questions and all were answered. The client agreed with the plan and demonstrated an understanding of the instructions. The client was advised to call our office if symptoms worsen or feel they are in  a crisis state and need immediate contact. Client also reminded of a crisis line number and to use 9-1-1 if there's an emergency.  Therapist Location: office; Client Location: home.  Subjective: Client reported feeling disassociated & depressed lately. Client reported that things were going good for her and anxiety ramped her up and made her feel like something bad is going to happen and it pushed her self-sabotage to help her "get it over with". Client is believin: nobody wants me around."Therapist used MI & CBt with client to support her and help her explore those triggers and thoughts that bring her to do that. Therapist and client processed what her self-sabotage behavior caused and the root of self-hate. Client recognized how trauma has caused her to self-sabotage because shes use to toxic chaos so she doesn't have to feel alone/wierd/etc. Therapist encouraged client to offer herself more compassion and used RPT with client to try to help her support herself. Therapist assessed for safety and sobriety and client denied SI/HI/AVH. Client reported increased THC use but motivation to reduce harm again.   Interventions: Cognitive Behavioral Therapy, Motivational Interviewing and RPT   Diagnosis:   ICD-10-CM   1. PTSD (post-traumatic stress disorder)  F43.10      Plan of Care:  Client is to return to therapy with therapist every 1-2 weeks as needed to process pain/grief/traumas in a safe space, to be re-evaluated in 3 months.  Client is to consider seeing a medication provider to explore possible need for medication to help them feel more like themselves. Client is to practice mindfulness AEB  daily meditation and body scans or as needed when flooded by emotion/pain/flashbacks or feeling numb.  Client is to learn/practice DBT wise mind & radical acceptance AEB understanding they don't have to live in extremes and can feel both happy and sad emotions, and good and bad memories and allowing themselves  to do so.  Client is to practice self-compassion AEB being gentle with themselves, utilizing self-care techniques daily or as needed when grieving something in the moment.  Client is to process grief/pain in a somatic body-felt sense way: ie using mindfulness, brainspotting, or trauma release as a method for releasing body pain/tension caused by grief. Client to step outside of her comfort zone to really be "herself".  Pauline Good, LCSW, LCAS, CCTP, CCS-I, BSP

## 2020-09-16 ENCOUNTER — Encounter: Payer: Self-pay | Admitting: Addiction (Substance Use Disorder)

## 2020-09-16 ENCOUNTER — Ambulatory Visit (INDEPENDENT_AMBULATORY_CARE_PROVIDER_SITE_OTHER): Payer: 59 | Admitting: Addiction (Substance Use Disorder)

## 2020-09-16 DIAGNOSIS — F331 Major depressive disorder, recurrent, moderate: Secondary | ICD-10-CM | POA: Diagnosis not present

## 2020-09-16 DIAGNOSIS — F4310 Post-traumatic stress disorder, unspecified: Secondary | ICD-10-CM | POA: Diagnosis not present

## 2020-09-16 NOTE — Progress Notes (Signed)
[facility] Counselor/Therapist Progress Note  Patient ID: Lori Gomez, MRN: [id number],    Date: 09/16/2020  Time Spent:  Treatment Type: Individual Therapy  Reported Symptoms: disassociated, insomnia self-sabotage.   Mental Status Exam:  Appearance:   Casual     Behavior:  Appropriate  Motor:  Normal  Speech/Language:   Clear and Coherent and Normal Rate  Affect:  Congruent and Full Range  Mood:  anxious and depressed  Thought process:  normal  Thought content:    Rumination  Sensory/Perceptual disturbances:    Flashbacks   Orientation:  x4  Attention:  Good  Concentration:  Fair  Memory:  WNL  Fund of knowledge:   Good  Insight:    Good  Judgment:   Fair  Impulse Control:  Fair   Risk Assessment: Danger to Self:  No Self-injurious Behavior: No Danger to Others: No Duty to Warn:no Physical Aggression / Violence:No  Access to Firearms a concern: No  Gang Involvement:No   Virtual Visit via VIDEO: I connected with client by MyChart video enabled telemedicine/telehealth application, with their informed consent, and verified client privacy and that I am speaking with the correct person using two identifiers. I discussed the limitations, risks, security and privacy concerns of performing psychotherapy and management service virtually and confirmed their location. I also discussed with the patient that there may be a patient responsible charge related to this service and to confirm with the front desk if their insurance covers teletherapy. I also discussed with the patient the availability of in person appointments. The patient expressed understanding and agreed to proceed. I discussed the treatment planning with the client. The client was provided an opportunity to ask questions and all were answered. The client agreed with the plan and demonstrated an understanding of the instructions. The client was advised to call our office if symptoms worsen or feel they are in  a crisis state and need immediate contact. Client also reminded of a crisis line number and to use 9-1-1 if there's an emergency.  Therapist Location: office; Client Location: home.  Subjective: Client reported increased depression and hopelessness coupled with frustration at people putting her down like her professor. Client processed the distress it brought her and how it made her feel singled out and "dumb". Therapist used MI & CBT with client to support her and help her challenge negative self talk. Therapist assesed client for safety and client denied SI/HI/AVH but hopelessness. Therapist used RPT with client to help her process some ways to help treat her depression/hopelessness such as exercise and looking into going to a [facility] location for an evaluation of txt.  Interventions: Cognitive Behavioral Therapy, Motivational Interviewing and RPT   Diagnosis:   ICD-10-CM   1. PTSD (post-traumatic stress disorder)  F43.10   2. Major depressive disorder, recurrent episode, moderate (HCC)  F33.1      Plan of Care:  Client is to return to therapy with therapist every 1-2 weeks as needed to process pain/grief/traumas in a safe space, to be re-evaluated in 3 months.  Client is to consider seeing a medication provider to explore possible need for medication to help them feel more like themselves. Client is to practice mindfulness AEB daily meditation and body scans or as needed when flooded by emotion/pain/flashbacks or feeling numb.  Client is to learn/practice DBT wise mind & radical acceptance AEB understanding they don't have to live in extremes and can feel both happy and sad emotions, and good and  bad memories and allowing themselves to do so.  Client is to practice self-compassion AEB being gentle with themselves, utilizing self-care techniques daily or as needed when grieving something in the moment.  Client is to process grief/pain in a somatic body-felt sense way: ie using mindfulness,  brainspotting, or trauma release as a method for releasing body pain/tension caused by grief. Client to step outside of her comfort zone to really be "herself".  Pauline Good, LCSW, LCAS, CCTP, CCS-I, BSP

## 2020-09-26 ENCOUNTER — Ambulatory Visit (INDEPENDENT_AMBULATORY_CARE_PROVIDER_SITE_OTHER): Payer: 59 | Admitting: Addiction (Substance Use Disorder)

## 2020-09-26 ENCOUNTER — Other Ambulatory Visit: Payer: Self-pay

## 2020-09-26 DIAGNOSIS — F4310 Post-traumatic stress disorder, unspecified: Secondary | ICD-10-CM | POA: Diagnosis not present

## 2020-09-26 NOTE — Progress Notes (Signed)
      [facility] Counselor/Therapist Progress Note  Patient ID: Lori Gomez, MRN: [id number],    Date: 09/26/2020  Time Spent:  Treatment Type: Individual Therapy  Reported Symptoms: frustrated, hopeless.  Mental Status Exam:  Appearance:   Casual     Behavior:  Appropriate  Motor:  Normal  Speech/Language:   Clear and Coherent and Normal Rate  Affect:  Congruent and Full Range  Mood:  depressed and irritable  Thought process:  normal  Thought content:    Rumination  Sensory/Perceptual disturbances:    Flashbacks   Orientation:  x4  Attention:  Good  Concentration:  Fair  Memory:  WNL  Fund of knowledge:   Good  Insight:    Good  Judgment:   Fair  Impulse Control:  Fair   Risk Assessment: Danger to Self:  No Self-injurious Behavior: No Danger to Others: No Duty to Warn:no Physical Aggression / Violence:No  Access to Firearms a concern: No  Gang Involvement:No   Subjective: Client reported increased stressors and struggling with work/life balance. Client reported: "its not really a balance. Im in fight or flight mode & im just working to survive." Client reported feeling very numb and disconnected from the world, while like an empath, feeling too much of others' feelings and therefore shutting down. Therapist used MI with client to support her and CBT to challenge clients negative self talk and help her draw boundaries for herself. Therapist validated the feelings she felt while helping her to regulate her emotions while they did a mindfulness exercise. Therapist assesed client for safety and client denied SI/HI/AVH.  Interventions: Cognitive Behavioral Therapy, Mindfulness Meditation, Motivational Interviewing and RPT   Diagnosis:   ICD-10-CM   1. PTSD (post-traumatic stress disorder)  F43.10     Plan of Care:  Client is to return to therapy with therapist every 1-2 weeks as needed to process pain/grief/traumas in a safe space, to be re-evaluated in 3 months.   Client is to consider seeing a medication provider to explore possible need for medication to help them feel more like themselves. Client is to practice mindfulness AEB daily meditation and body scans or as needed when flooded by emotion/pain/flashbacks or feeling numb.  Client is to learn/practice DBT wise mind & radical acceptance AEB understanding they don't have to live in extremes and can feel both happy and sad emotions, and good and bad memories and allowing themselves to do so.  Client is to practice self-compassion AEB being gentle with themselves, utilizing self-care techniques daily or as needed when grieving something in the moment.  Client is to process grief/pain in a somatic body-felt sense way: ie using mindfulness, brainspotting, or trauma release as a method for releasing body pain/tension caused by grief. Client to step outside of her comfort zone to really be "herself".  Pauline Good, LCSW, LCAS, CCTP, CCS-I, BSP

## 2020-10-11 ENCOUNTER — Ambulatory Visit (INDEPENDENT_AMBULATORY_CARE_PROVIDER_SITE_OTHER): Payer: 59 | Admitting: Addiction (Substance Use Disorder)

## 2020-10-11 ENCOUNTER — Other Ambulatory Visit: Payer: Self-pay

## 2020-10-11 DIAGNOSIS — F4310 Post-traumatic stress disorder, unspecified: Secondary | ICD-10-CM

## 2020-10-11 NOTE — Progress Notes (Signed)
      [facility] Counselor/Therapist Progress Note  Patient ID: Lori Gomez, MRN: [id number],    Date: 10/11/2020  Time Spent:  Treatment Type: Individual Therapy  Reported Symptoms: poured out/ feeling alone.   Mental Status Exam:  Appearance:   Casual     Behavior:  Appropriate  Motor:  Normal  Speech/Language:   Clear and Coherent and Normal Rate  Affect:  Congruent and Full Range  Mood:  anxious, labile and sad  Thought process:  normal  Thought content:    Rumination  Sensory/Perceptual disturbances:    Flashbacks   Orientation:  x4  Attention:  Good  Concentration:  Fair  Memory:  WNL  Fund of knowledge:   Good  Insight:    Good  Judgment:   Fair  Impulse Control:  Fair   Risk Assessment: Danger to Self:  No Self-injurious Behavior: Yes.  without intent/plan Danger to Others: No Duty to Warn:no Physical Aggression / Violence:No  Access to Firearms a concern: No  Gang Involvement:No   Subjective: Client reported feeling poured out emotionally and feeling alone. Client shared she felt super dysregulated and reported self-harm. Therapist assessed for safety and client denied SI/HI/AVH. Client realizing her sense of self is too tied into her school academics. Client working to regulate regardless of her As. Therapist used MI & DBT to support her and encourage her to use distress tolerance and emotion regulation skills. Client made progress recognizing that the only praise/validation she got from her parents was when she got As and the fact that she's graduating 2+ years early from college. Therapist reviewed with client ways to empower herself to see her worth & techniques for regulating emotions.   Interventions: Dialectical Behavioral Therapy, Motivational Interviewing and RPT   Diagnosis:   ICD-10-CM   1. PTSD (post-traumatic stress disorder)  F43.10     Plan of Care:  Client is to return to therapy with therapist every 1-2 weeks as needed to process  pain/grief/traumas in a safe space, to be re-evaluated in 3 months.  Client is to consider seeing a medication provider to explore possible need for medication to help them feel more like themselves. Client is to practice mindfulness AEB daily meditation and body scans or as needed when flooded by emotion/pain/flashbacks or feeling numb.  Client is to learn/practice DBT wise mind & radical acceptance AEB understanding they don't have to live in extremes and can feel both happy and sad emotions, and good and bad memories and allowing themselves to do so.  Client is to practice self-compassion AEB being gentle with themselves, utilizing self-care techniques daily or as needed when grieving something in the moment.  Client is to process grief/pain in a somatic body-felt sense way: ie using mindfulness, brainspotting, or trauma release as a method for releasing body pain/tension caused by grief. Client to step outside of her comfort zone to really be "herself".  Pauline Good, LCSW, LCAS, CCTP, CCS-I, BSP

## 2020-10-27 ENCOUNTER — Ambulatory Visit (INDEPENDENT_AMBULATORY_CARE_PROVIDER_SITE_OTHER): Payer: 59 | Admitting: Addiction (Substance Use Disorder)

## 2020-10-27 DIAGNOSIS — F4310 Post-traumatic stress disorder, unspecified: Secondary | ICD-10-CM | POA: Diagnosis not present

## 2020-10-27 NOTE — Progress Notes (Signed)
[facility] Counselor/Therapist Progress Note  Patient ID: Lori Gomez, MRN: [id number],    Date: 10/27/2020  Time Spent:  Treatment Type: Individual Therapy  Reported Symptoms: more regulated, but just lots of chaos around her  Mental Status Exam:  Appearance:   Casual     Behavior:  Appropriate  Motor:  Normal  Speech/Language:   Clear and Coherent and Normal Rate  Affect:  Congruent and Full Range  Mood:  irritable  Thought process:  normal  Thought content:    Rumination  Sensory/Perceptual disturbances:    Flashbacks   Orientation:  x4  Attention:  Good  Concentration:  Fair  Memory:  WNL  Fund of knowledge:   Good  Insight:    Good  Judgment:   Good  Impulse Control:  Fair   Risk Assessment: Danger to Self:  No Self-injurious Behavior: Yes.  without intent/plan Danger to Others: No Duty to Warn:no Physical Aggression / Violence:No  Access to Firearms a concern: No  Gang Involvement:No   .Virtual Visit via VIDEO: I connected with client by MyChart video enabled telemedicine/telehealth application with their informed consent, and verified client privacy and that I am speaking with the correct person using two identifiers. I discussed the limitations, risks, security and privacy concerns of performing psychotherapy and management service virtually and confirmed their location. I also discussed with the patient that there may be a patient responsible charge related to this service and to confirm with the front desk if their insurance covers teletherapy. I also discussed with the patient the availability of in person appointments. The patient expressed understanding and agreed to proceed. I discussed the treatment planning with the client. The client was provided an opportunity to ask questions and all were answered. The client agreed with the plan and demonstrated an understanding of the instructions. The client was advised to call our office if symptoms  worsen or feel they are in a crisis state and need immediate contact. Client also reminded of a crisis line number and to use 9-1-1 if there's an emergency.  Therapist Location: office; Client Location: home.  Subjective: Client reported doing much better regulating herself and not stuffing her emotions until she bursts. Therapist used MI & mindfulness to support her and continue to encourage her to find emotional ways to ground. Client was irritated at some family drama that is hurting her grandma who is frail and injured after an accident and her son is not kind to her. Client feels the pain of her grandma and internalizes it, but is working not to "take on others' feelings". Client and therapist discussed her RP plan and reviewed ways she could cope. Client making progress with her MH & client denied any SI/HI/AVH.   Interventions: Mindfulness Meditation, Motivational Interviewing and RPT   Diagnosis:   ICD-10-CM   1. PTSD (post-traumatic stress disorder)  F43.10      Plan of Care:  Client is to return to therapy with therapist every 1-2 weeks as needed to process pain/grief/traumas in a safe space, to be re-evaluated in 3 months.  Client is to consider seeing a medication provider to explore possible need for medication to help them feel more like themselves. Client is to practice mindfulness AEB daily meditation and body scans or as needed when flooded by emotion/pain/flashbacks or feeling numb.  Client is to learn/practice DBT wise mind & radical acceptance AEB understanding they don't have to live in extremes and can feel both happy and  sad emotions, and good and bad memories and allowing themselves to do so.  Client is to practice self-compassion AEB being gentle with themselves, utilizing self-care techniques daily or as needed when grieving something in the moment.  Client is to process grief/pain in a somatic body-felt sense way: ie using mindfulness, brainspotting, or trauma release as  a method for releasing body pain/tension caused by grief. Client to step outside of her comfort zone to really be "herself".  Pauline Good, LCSW, LCAS, CCTP, CCS-I, BSP

## 2020-11-07 ENCOUNTER — Other Ambulatory Visit: Payer: Self-pay

## 2020-11-07 ENCOUNTER — Ambulatory Visit (INDEPENDENT_AMBULATORY_CARE_PROVIDER_SITE_OTHER): Payer: 59 | Admitting: Addiction (Substance Use Disorder)

## 2020-11-07 DIAGNOSIS — F4310 Post-traumatic stress disorder, unspecified: Secondary | ICD-10-CM

## 2020-11-07 NOTE — Progress Notes (Signed)
      [facility] Counselor/Therapist Progress Note  Patient ID: Lori Gomez, MRN: [id number],    Date: 11/07/2020  Time Spent:  Treatment Type: Individual Therapy  Reported Symptoms: feels like shes walking on eggshells; anger outburts  Mental Status Exam:  Appearance:   Casual     Behavior:  Appropriate  Motor:  Normal  Speech/Language:   Clear and Coherent and Normal Rate  Affect:  Congruent and Full Range  Mood:  anxious and irritable  Thought process:  normal  Thought content:    Rumination  Sensory/Perceptual disturbances:    Flashbacks   Orientation:  x4  Attention:  Good  Concentration:  Fair  Memory:  WNL  Fund of knowledge:   Good  Insight:    Good  Judgment:   Good  Impulse Control:  Fair   Risk Assessment: Danger to Self:  No Self-injurious Behavior: Yes.  without intent/plan Danger to Others: No Duty to Warn:no Physical Aggression / Violence:No  Access to Firearms a concern: No  Gang Involvement:No   Subjective: Client reported learning that anger is an issue for her esp after her gf broke up with her. Therapist used DBT with client to help her regulate her emotions. Client expressed the sadness she's feeling as a result of losing her gf. Client expressed her realization of it being a toxic relationship, and how this is reminding her of her childhood. Client recognized her felt inability to speak her mind and therapist used MI & roleplaying expressing her emotions and how to ground and be present with her emotions (mindfulness). Therapist assessed for safety and client denied any SI/HI/AVH.   Interventions: Dialectical Behavioral Therapy, Mindfulness Meditation, Motivational Interviewing and RPT   Diagnosis:   ICD-10-CM   1. PTSD (post-traumatic stress disorder)  F43.10     Plan of Care:  Client is to return to therapy with therapist every 1-2 weeks as needed to process pain/grief/traumas in a safe space, to be re-evaluated in 3 months.  Client  is to consider seeing a medication provider to explore possible need for medication to help them feel more like themselves. Client is to practice mindfulness AEB daily meditation and body scans or as needed when flooded by emotion/pain/flashbacks or feeling numb.  Client is to learn/practice DBT wise mind & radical acceptance AEB understanding they don't have to live in extremes and can feel both happy and sad emotions, and good and bad memories and allowing themselves to do so.  Client is to practice self-compassion AEB being gentle with themselves, utilizing self-care techniques daily or as needed when grieving something in the moment.  Client is to process grief/pain in a somatic body-felt sense way: ie using mindfulness, brainspotting, or trauma release as a method for releasing body pain/tension caused by grief. Client to step outside of her comfort zone to really be "herself".  Pauline Good, LCSW, LCAS, CCTP, CCS-I, BSP

## 2020-11-29 ENCOUNTER — Encounter: Payer: Self-pay | Admitting: Addiction (Substance Use Disorder)

## 2020-11-29 ENCOUNTER — Ambulatory Visit (INDEPENDENT_AMBULATORY_CARE_PROVIDER_SITE_OTHER): Payer: 59 | Admitting: Addiction (Substance Use Disorder)

## 2020-11-29 DIAGNOSIS — F4310 Post-traumatic stress disorder, unspecified: Secondary | ICD-10-CM

## 2020-11-29 NOTE — Progress Notes (Signed)
[facility] Counselor/Therapist Progress Note  Patient ID: Lori Gomez, MRN: [id number],    Date: 11/29/2020  Time Spent:  Treatment Type: Individual Therapy  Reported Symptoms: relieved and excited.  Mental Status Exam:  Appearance:   Casual     Behavior:  Appropriate  Motor:  Normal  Speech/Language:   Clear and Coherent and Normal Rate  Affect:  Congruent and Full Range  Mood:  normal  Thought process:  normal  Thought content:    Rumination  Sensory/Perceptual disturbances:    Flashbacks   Orientation:  x4  Attention:  Good  Concentration:  Fair  Memory:  WNL  Fund of knowledge:   Good  Insight:    Good  Judgment:   Good  Impulse Control:  Fair   Risk Assessment: Danger to Self:  No Self-injurious Behavior: No Danger to Others: No Duty to Warn:no Physical Aggression / Violence:No  Access to Firearms a concern: No  Gang Involvement:No   Virtual Visit via VIDEO: I connected with client by MyChart video enabled telemedicine/telehealth application, with their informed consent, and verified client privacy and that I am speaking with the correct person using two identifiers. I discussed the limitations, risks, security and privacy concerns of performing psychotherapy and management service virtually and confirmed their location. I also discussed with the patient that there may be a patient responsible charge related to this service and to confirm with the front desk if their insurance covers teletherapy. I also discussed with the patient the availability of in person appointments. The patient expressed understanding and agreed to proceed. I discussed the treatment planning with the client. The client was provided an opportunity to ask questions and all were answered. The client agreed with the plan and demonstrated an understanding of the instructions. The client was advised to call our office if symptoms worsen or feel they are in a crisis state and need  immediate contact. Client also reminded of a crisis line number and to use 9-1-1 if there's an emergency.  Therapist Location: office; Client Location: home.  Subjective: Client reported feeling relieved that she is done with college courses and exams. Client reported more stability and less instability; client denied SI/HI/AVH and denied self harm. Client discussed a conflict with her gf again and how sensitive her gf is with her and how she takes it out on he client. Client practiced roleplaying with the therapist and therapist used MI to support client in processing how she may need to discuss her expectations with mature discussion of each other & each of their feelings.  Therapist assessed for safety and client denied any SI/HI/AVH.   Interventions: Roleplay, Motivational Interviewing and RPT   Diagnosis:   ICD-10-CM   1. PTSD (post-traumatic stress disorder)  F43.10     Plan of Care:  Client is to return to therapy with therapist every 1-2 weeks as needed to process pain/grief/traumas in a safe space, to be re-evaluated in 3 months.  Client is to consider seeing a medication provider to explore possible need for medication to help them feel more like themselves. Client is to practice mindfulness AEB daily meditation and body scans or as needed when flooded by emotion/pain/flashbacks or feeling numb.  Client is to learn/practice DBT wise mind & radical acceptance AEB understanding they don't have to live in extremes and can feel both happy and sad emotions, and good and bad memories and allowing themselves to do so.  Client is to practice self-compassion AEB  being gentle with themselves, utilizing self-care techniques daily or as needed when grieving something in the moment.  Client is to process grief/pain in a somatic body-felt sense way: ie using mindfulness, brainspotting, or trauma release as a method for releasing body pain/tension caused by grief. Client to step outside of her comfort  zone to really be "herself".  Pauline Good, LCSW, LCAS, CCTP, CCS-I, BSP

## 2020-12-13 ENCOUNTER — Ambulatory Visit (INDEPENDENT_AMBULATORY_CARE_PROVIDER_SITE_OTHER): Payer: 59 | Admitting: Addiction (Substance Use Disorder)

## 2020-12-13 ENCOUNTER — Other Ambulatory Visit: Payer: Self-pay

## 2020-12-13 DIAGNOSIS — F411 Generalized anxiety disorder: Secondary | ICD-10-CM

## 2020-12-13 NOTE — Progress Notes (Signed)
      [facility] Counselor/Therapist Progress Note  Patient ID: Lori Gomez, MRN: [id number],    Date: 12/13/2020  Time Spent:  Treatment Type: Individual Therapy  Reported Symptoms: stressed, nervous  Mental Status Exam:  Appearance:   Casual     Behavior:  Appropriate  Motor:  Normal  Speech/Language:   Clear and Coherent and Normal Rate  Affect:  Congruent and Full Range  Mood:  anxious  Thought process:  normal  Thought content:    Rumination  Sensory/Perceptual disturbances:    Flashbacks   Orientation:  x4  Attention:  Good  Concentration:  Fair  Memory:  WNL  Fund of knowledge:   Good  Insight:    Good  Judgment:   Good  Impulse Control:  Fair   Risk Assessment: Danger to Self:  No Self-injurious Behavior: No Danger to Others: No Duty to Warn:no Physical Aggression / Violence:No  Access to Firearms a concern: No  Gang Involvement:No    Subjective: Client reported getting frustrated with her mom trying to scare her about big changes coming up- being on her own and out of school. Client reported already being nervous about it and thinking a lot about it and ways to not fail in "real life", and it just aggravating her that her mom doesnt think shes already considering these responsibilities. 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 Therapist used MI to support client and validate her fear while helping client to understand more about background checks and HIPAA. Therapist also used SFT with client to help her consider ways to word things on job applications if they ask about her MH. Therapist assessed for safety and client denied any SI/HI/AVH.   Interventions: Motivational Interviewing, Solution-Oriented/Positive Psychology and RPT   Diagnosis:    ICD-10-CM   1. Generalized anxiety disorder  F41.1     Plan of Care:  Client is to return to therapy with therapist every 1-2 weeks as needed to process pain/grief/traumas in a safe space, to be re-evaluated in 3 months.  Client is to consider seeing a medication provider to explore possible need for medication to help them feel more like themselves. Client is to practice mindfulness AEB daily meditation and body scans or as needed when flooded by emotion/pain/flashbacks or feeling numb.  Client is to learn/practice DBT wise mind & radical acceptance AEB understanding they don't have to live in extremes and can feel both happy and sad emotions, and good and bad memories and allowing themselves to do so.  Client is to practice self-compassion AEB being gentle with themselves, utilizing self-care techniques daily or as needed when grieving something in the moment.  Client is to process grief/pain in a somatic body-felt sense way: ie using mindfulness, brainspotting, or trauma release as a method for releasing body pain/tension caused by grief. Client to step outside of her comfort zone to really be "herself".  Pauline Good, LCSW, LCAS, CCTP, CCS-I, BSP

## 2020-12-21 ENCOUNTER — Ambulatory Visit: Payer: 59 | Admitting: Addiction (Substance Use Disorder)

## 2021-02-14 ENCOUNTER — Other Ambulatory Visit (HOSPITAL_COMMUNITY): Payer: Self-pay

## 2021-02-14 DIAGNOSIS — S60812A Abrasion of left wrist, initial encounter: Secondary | ICD-10-CM | POA: Diagnosis not present

## 2021-02-14 DIAGNOSIS — X58XXXA Exposure to other specified factors, initial encounter: Secondary | ICD-10-CM | POA: Diagnosis not present

## 2021-02-14 DIAGNOSIS — Z23 Encounter for immunization: Secondary | ICD-10-CM | POA: Diagnosis not present

## 2021-02-14 MED ORDER — CEPHALEXIN 500 MG PO CAPS
500.0000 mg | ORAL_CAPSULE | Freq: Three times a day (TID) | ORAL | 0 refills | Status: AC
  Filled 2021-02-14: qty 30, 10d supply, fill #0

## 2021-02-17 ENCOUNTER — Ambulatory Visit: Payer: 59 | Admitting: Addiction (Substance Use Disorder)

## 2021-03-03 ENCOUNTER — Other Ambulatory Visit: Payer: Self-pay

## 2021-03-03 ENCOUNTER — Ambulatory Visit (INDEPENDENT_AMBULATORY_CARE_PROVIDER_SITE_OTHER): Payer: 59 | Admitting: Addiction (Substance Use Disorder)

## 2021-03-03 DIAGNOSIS — F411 Generalized anxiety disorder: Secondary | ICD-10-CM

## 2021-03-03 NOTE — Progress Notes (Signed)
      [facility] Counselor/Therapist Progress Note  Patient ID: VERMELLE CAMMARATA, MRN: [id number],    Date: 03/03/2021  Time Spent:  Treatment Type: Individual Therapy  Reported Symptoms: stressed, nervous, anger  Mental Status Exam:  Appearance:   Casual     Behavior:  Appropriate  Motor:  Normal  Speech/Language:   Clear and Coherent and Normal Rate  Affect:  Congruent and Full Range  Mood:  angry, anxious, irritable, and sad  Thought process:  normal  Thought content:    Rumination  Sensory/Perceptual disturbances:    Flashbacks   Orientation:  x4  Attention:  Good  Concentration:  Fair  Memory:  WNL  Fund of knowledge:   Good  Insight:    Good  Judgment:   Good  Impulse Control:  Fair   Risk Assessment: Danger to Self:  No Self-injurious Behavior: No Danger to Others: No Duty to Warn:no Physical Aggression / Violence:No  Access to Firearms a concern: No  Gang Involvement:No    Subjective: Client reported lots of feelings: anxiety, irritation, anger, and sadness right now.  Client discussed her stress and life stressors. Client reports struggling under those financial, relational, and emotional pressures and having anxiety to the point of throwing up. Therapist used MI & SFT with client to validate her feelings, support her in helping her to find ways to manage her stress and solutions to help her find physical relief from her nausea etc. Client tearfully talked about her mom getting rid of their dog without her able to say goodbye and is grieving and client shared her frustration with her mom. Client processed other college and adulting stressors and asked for guidance. Therapist assessed for safety and client denied any SI/HI/AVH.   Interventions: Motivational Interviewing, Solution-Oriented/Positive Psychology and RPT    Diagnosis:   ICD-10-CM   1. Generalized anxiety disorder  F41.1       Plan of Care:  Client is to return to therapy with therapist every  1-2 weeks as needed to process pain/grief/traumas in a safe space, to be re-evaluated in 3 months.  Client is to consider seeing a medication provider to explore possible need for medication to help them feel more like themselves. Client is to practice mindfulness AEB daily meditation and body scans or as needed when flooded by emotion/pain/flashbacks or feeling numb.  Client is to learn/practice DBT wise mind & radical acceptance AEB understanding they don't have to live in extremes and can feel both happy and sad emotions, and good and bad memories and allowing themselves to do so.  Client is to practice self-compassion AEB being gentle with themselves, utilizing self-care techniques daily or as needed when grieving something in the moment.  Client is to process grief/pain in a somatic body-felt sense way: ie using mindfulness, brainspotting, or trauma release as a method for releasing body pain/tension caused by grief. Client to step outside of her comfort zone to really be "herself".  Pauline Good, LCSW, LCAS, CCTP, CCS-I, BSP

## 2021-03-07 ENCOUNTER — Other Ambulatory Visit (HOSPITAL_COMMUNITY): Payer: Self-pay

## 2021-03-07 DIAGNOSIS — N760 Acute vaginitis: Secondary | ICD-10-CM | POA: Diagnosis not present

## 2021-03-07 MED ORDER — FLUCONAZOLE 150 MG PO TABS
150.0000 mg | ORAL_TABLET | Freq: Every day | ORAL | 0 refills | Status: AC
  Filled 2021-03-07: qty 2, 4d supply, fill #0

## 2021-03-17 ENCOUNTER — Other Ambulatory Visit: Payer: Self-pay

## 2021-03-17 ENCOUNTER — Ambulatory Visit (INDEPENDENT_AMBULATORY_CARE_PROVIDER_SITE_OTHER): Payer: 59 | Admitting: Addiction (Substance Use Disorder)

## 2021-03-17 DIAGNOSIS — F411 Generalized anxiety disorder: Secondary | ICD-10-CM | POA: Diagnosis not present

## 2021-03-17 DIAGNOSIS — F4310 Post-traumatic stress disorder, unspecified: Secondary | ICD-10-CM

## 2021-03-17 NOTE — Progress Notes (Signed)
      [facility] Counselor/Therapist Progress Note  Patient ID: Lori Gomez, MRN: [id number],    Date: 03/17/2021  Time Spent:   Treatment Type: Individual Therapy  Reported Symptoms: tired, stressed  Mental Status Exam:  Appearance:   Casual     Behavior:  Appropriate  Motor:  Normal  Speech/Language:   Clear and Coherent and Normal Rate  Affect:  Congruent and Full Range  Mood:  normal and tired/stressed  Thought process:  normal  Thought content:    Rumination  Sensory/Perceptual disturbances:    Flashbacks   Orientation:  x4  Attention:  Good  Concentration:  Fair  Memory:  WNL  Fund of knowledge:   Good  Insight:    Good  Judgment:   Good  Impulse Control:  Fair   Risk Assessment: Danger to Self:  No Self-injurious Behavior: No Danger to Others: No Duty to Warn:no Physical Aggression / Violence:No  Access to Firearms a concern: No  Gang Involvement:No    Subjective: Client reported feeling worn out physically from 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 Client stressed that her funds wont cover her costs and is talking to her mom who recently asked her to start covering her own car insurance. Client stressed and also feeling a little lost as she tries to find a job shed like to work, maybe outside of [place] to give her some space from her family. Therapist used MI to validate her feelings & help support her manage her stress and reduce anxiety. Therapist assessed for safety and client denied any SI/HI/AVH.   Interventions: Mindfulness Meditation, Motivational Interviewing, and RPT    Diagnosis:   ICD-10-CM   1. Generalized anxiety disorder  F41.1     2. PTSD (post-traumatic stress disorder)  F43.10        Plan of Care:  Client is to return to therapy with therapist every 1-2 weeks as needed to process pain/grief/traumas in a safe space, to be re-evaluated in 3  months.  Client is to consider seeing a medication provider to explore possible need for medication to help them feel more like themselves. Client is to practice mindfulness AEB daily meditation and body scans or as needed when flooded by emotion/pain/flashbacks or feeling numb.  Client is to learn/practice DBT wise mind & radical acceptance AEB understanding they don't have to live in extremes and can feel both happy and sad emotions, and good and bad memories and allowing themselves to do so.  Client is to practice self-compassion AEB being gentle with themselves, utilizing self-care techniques daily or as needed when grieving something in the moment.  Client is to process grief/pain in a somatic body-felt sense way: ie using mindfulness, brainspotting, or trauma release as a method for releasing body pain/tension caused by grief. Client to step outside of her comfort zone to really be "herself".  Pauline Good, LCSW, LCAS, CCTP, CCS-I, BSP

## 2021-04-07 ENCOUNTER — Other Ambulatory Visit: Payer: Self-pay

## 2021-04-07 ENCOUNTER — Ambulatory Visit (INDEPENDENT_AMBULATORY_CARE_PROVIDER_SITE_OTHER): Payer: 59 | Admitting: Addiction (Substance Use Disorder)

## 2021-04-07 DIAGNOSIS — F422 Mixed obsessional thoughts and acts: Secondary | ICD-10-CM

## 2021-04-07 DIAGNOSIS — F4310 Post-traumatic stress disorder, unspecified: Secondary | ICD-10-CM

## 2021-04-07 NOTE — Progress Notes (Signed)
      [facility] Counselor/Therapist Progress Note  Patient ID: Lori Gomez, MRN: [id number],    Date: 04/07/2021  Time Spent:  Treatment Type: Individual Therapy  Reported Symptoms: frustrated- fighting with her family  Mental Status Exam:  Appearance:   Casual     Behavior:  Appropriate  Motor:  Normal  Speech/Language:   Clear and Coherent and Normal Rate  Affect:  Congruent and Full Range  Mood:  stressed  Thought process:  normal  Thought content:    Rumination  Sensory/Perceptual disturbances:    Flashbacks   Orientation:  x4  Attention:  Good  Concentration:  Fair  Memory:  WNL  Fund of knowledge:   Good  Insight:    Good  Judgment:   Good  Impulse Control:  Fair   Risk Assessment: Danger to Self:  No Self-injurious Behavior: No Danger to Others: No Duty to Warn:no Physical Aggression / Violence:No  Access to Firearms a concern: No  Gang Involvement:No    Subjective: Client reported having issues with her cousin who she was renting a house with. Client was trying to clean all his mess up after he wouldn't, and he got upset. It created a family feud and client's grandma had to get involved in mediating a reconciliation and apology. Client thankful it is better but also felt vulnerable and attacked and misunderstood. Therapist used MI & CBT with client to validate her feelings and help her process through her feelings of resentment and frustration and her thoughts about her family. 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 Client excited for a job interview she has next week for that position. Client reported it as a reason to stop smoking & client made progress stopping recently. Therapist assessed for safety and client denied any SI/HI/AVH.   Interventions: Cognitive Behavioral Therapy, Assertiveness/Communication, Motivational Interviewing, and RPT    Diagnosis:    ICD-10-CM   1. PTSD (post-traumatic stress disorder)  F43.10     2. Mixed obsessional thoughts and acts  F42.2         Plan of Care:  Client is to return to therapy with therapist every 1-2 weeks as needed to process pain/grief/traumas in a safe space, to be re-evaluated in 3 months.  Client is to consider seeing a medication provider to explore possible need for medication to help them feel more like themselves. Client is to practice mindfulness AEB daily meditation and body scans or as needed when flooded by emotion/pain/flashbacks or feeling numb.  Client is to learn/practice DBT wise mind & radical acceptance AEB understanding they don't have to live in extremes and can feel both happy and sad emotions, and good and bad memories and allowing themselves to do so.  Client is to practice self-compassion AEB being gentle with themselves, utilizing self-care techniques daily or as needed when grieving something in the moment.  Client is to process grief/pain in a somatic body-felt sense way: ie using mindfulness, brainspotting, or trauma release as a method for releasing body pain/tension caused by grief. Client to step outside of her comfort zone to really be "herself".  Pauline Good, LCSW, LCAS, CCTP, CCS-I, BSP

## 2021-05-05 ENCOUNTER — Ambulatory Visit (INDEPENDENT_AMBULATORY_CARE_PROVIDER_SITE_OTHER): Payer: 59 | Admitting: Addiction (Substance Use Disorder)

## 2021-05-05 ENCOUNTER — Other Ambulatory Visit: Payer: Self-pay

## 2021-05-05 DIAGNOSIS — F411 Generalized anxiety disorder: Secondary | ICD-10-CM | POA: Diagnosis not present

## 2021-05-05 NOTE — Progress Notes (Signed)
      [facility] Counselor/Therapist Progress Note  Patient ID: Lori Gomez, MRN: [id number],    Date: 05/05/2021  Time Spent:  Treatment Type: Individual Therapy  Reported Symptoms: looking forward to things   Mental Status Exam:  Appearance:   Casual     Behavior:  Appropriate  Motor:  Normal  Speech/Language:   Clear and Coherent and Normal Rate  Affect:  Congruent and Full Range  Mood:  normal  Thought process:  normal  Thought content:    WNL  Sensory/Perceptual disturbances:    Flashbacks   Orientation:  x4  Attention:  Good  Concentration:  Fair  Memory:  WNL  Fund of knowledge:   Good  Insight:    Good  Judgment:   Good  Impulse Control:  Good   Risk Assessment: Danger to Self:  No Self-injurious Behavior: No Danger to Others: No Duty to Warn:no Physical Aggression / Violence:No  Access to Firearms a concern: No  Gang Involvement:No    Subjective: Client reported lots of anticipation about big next steps in her life. 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 Client is going through background checks and excited to take her first job in [employer], [employer]. Client shared that she was excited she finally got her diploma for her undergrad degree and is proud of herself for doing so well and growing up so fast. Therapist used MI & supportive therapy & CBT with client to affirm her progress and her strengths, help encourage her for next steps, and discuss her feelings about it all. Therapist assessed for safety and client denied any SI/HI/AVH.   Interventions: Cognitive Behavioral Therapy, Motivational Interviewing, and RPT    Diagnosis:   ICD-10-CM   1. Generalized anxiety disorder  F41.1        Plan of Care:  Client is to return to therapy with therapist every 1-2 weeks as needed to process pain/grief/traumas in a safe space, to be re-evaluated in 3 months.  Client is to consider seeing a  medication provider to explore possible need for medication to help them feel more like themselves. Client is to practice mindfulness AEB daily meditation and body scans or as needed when flooded by emotion/pain/flashbacks or feeling numb.  Client is to learn/practice DBT wise mind & radical acceptance AEB understanding they don't have to live in extremes and can feel both happy and sad emotions, and good and bad memories and allowing themselves to do so.  Client is to practice self-compassion AEB being gentle with themselves, utilizing self-care techniques daily or as needed when grieving something in the moment.  Client is to process grief/pain in a somatic body-felt sense way: ie using mindfulness, brainspotting, or trauma release as a method for releasing body pain/tension caused by grief. Client to step outside of her comfort zone to really be "herself".  Pauline Good, LCSW, LCAS, CCTP, CCS-I, BSP

## 2021-05-19 ENCOUNTER — Ambulatory Visit (INDEPENDENT_AMBULATORY_CARE_PROVIDER_SITE_OTHER): Payer: 59 | Admitting: Addiction (Substance Use Disorder)

## 2021-05-19 DIAGNOSIS — F411 Generalized anxiety disorder: Secondary | ICD-10-CM

## 2021-05-19 NOTE — Progress Notes (Signed)
[facility] Counselor/Therapist Progress Note  Patient ID: Lori Gomez, MRN: [id number],    Date: 05/19/2021  Time Spent:  Treatment Type: Individual Therapy  Reported Symptoms: more assertive bec tired/ stressed  Mental Status Exam:  Appearance:   Casual     Behavior:  Appropriate  Motor:  Normal  Speech/Language:   Clear and Coherent and Normal Rate  Affect:  Congruent and Full Range  Mood:  normal and stressed  Thought process:  normal  Thought content:    WNL  Sensory/Perceptual disturbances:    Flashbacks   Orientation:  x4  Attention:  Good  Concentration:  Fair  Memory:  WNL  Fund of knowledge:   Good  Insight:    Good  Judgment:   Good  Impulse Control:  Good   Risk Assessment: Danger to Self:  No Self-injurious Behavior: No Danger to Others: No Duty to Warn:no Physical Aggression / Violence:No  Access to Firearms a concern: No  Gang Involvement:No   Virtual Visit via VIDEO: I connected with client by MyChart video enabled telemedicine/telehealth application, with their informed consent, and verified client privacy and that I am speaking with the correct person using two identifiers. I discussed the limitations, risks, security and privacy concerns of performing psychotherapy and management service virtually and confirmed their location. I also discussed with the patient that there may be a patient responsible charge related to this service and to confirm with the front desk if their insurance covers teletherapy. I also discussed with the patient the availability of in person appointments. The patient expressed understanding and agreed to proceed. I discussed the treatment planning with the client. The client was provided an opportunity to ask questions and all were answered. The client agreed with the plan and demonstrated an understanding of the instructions. The client was advised to call our office if symptoms worsen or feel they are in a crisis  state and need immediate contact. Client also reminded of a crisis line number and to use 9-1-1 if there's an emergency.  Therapist Location: office; Client Location: home.  Subjective: Client reported feeling really stressed and stretched on what she can emotionally handle lately. It is the year anniversary of her grandpas death and client emotional about that along with tired of family drama & unrealistic expectations. Client processed an issue at work, working with her family, who didn't pay her for hours worked. Client becoming more assetive but nervous about addressing it with them. Therapist used Mi, roleplaying and SFT with client to validate her feelings, support her in processing the situation and internal conflict, and to help practice assertively asking for payment in full for her hours worked. Client also making plans to take a new job to reduce stress with her family job. Therapist assessed for safety and client denied any SI/HI/AVH.   Interventions: Roleplay, Motivational Interviewing, Solution-Oriented/Positive Psychology, and RPT    Diagnosis:   ICD-10-CM   1. Generalized anxiety disorder  F41.1       Plan of Care:  Client is to return to therapy with therapist every 1-2 weeks as needed to process pain/grief/traumas in a safe space, to be re-evaluated in 3 months.  Client is to consider seeing a medication provider to explore possible need for medication to help them feel more like themselves. Client is to practice mindfulness AEB daily meditation and body scans or as needed when flooded by emotion/pain/flashbacks or feeling numb.  Client is to learn/practice DBT wise mind &  radical acceptance AEB understanding they don't have to live in extremes and can feel both happy and sad emotions, and good and bad memories and allowing themselves to do so.  Client is to practice self-compassion AEB being gentle with themselves, utilizing self-care techniques daily or as needed when grieving  something in the moment.  Client is to process grief/pain in a somatic body-felt sense way: ie using mindfulness, brainspotting, or trauma release as a method for releasing body pain/tension caused by grief. Client to step outside of her comfort zone to really be "herself".  Pauline Good, LCSW, LCAS, CCTP, CCS-I, BSP

## 2021-06-02 ENCOUNTER — Ambulatory Visit: Payer: 59 | Admitting: Addiction (Substance Use Disorder)

## 2021-06-30 ENCOUNTER — Ambulatory Visit (INDEPENDENT_AMBULATORY_CARE_PROVIDER_SITE_OTHER): Payer: 59 | Admitting: Addiction (Substance Use Disorder)

## 2021-06-30 ENCOUNTER — Other Ambulatory Visit: Payer: Self-pay

## 2021-06-30 DIAGNOSIS — F411 Generalized anxiety disorder: Secondary | ICD-10-CM | POA: Diagnosis not present

## 2021-06-30 DIAGNOSIS — F331 Major depressive disorder, recurrent, moderate: Secondary | ICD-10-CM

## 2021-06-30 NOTE — Progress Notes (Signed)
[facility] Counselor/Therapist Progress Note  Patient ID: Lori Gomez, MRN: [id number],    Date: 06/30/2021  Time Spent:  Treatment Type: Individual Therapy  Reported Symptoms: despairing, depressed  Mental Status Exam:  Appearance:   Casual     Behavior:  Appropriate  Motor:  Normal  Speech/Language:   Clear and Coherent and Normal Rate  Affect:  Congruent, Full Range, and Tearful  Mood:  depressed and sad  Thought process:  normal  Thought content:    WNL  Sensory/Perceptual disturbances:    WNLs   Orientation:  x4  Attention:  Good  Concentration:  Fair  Memory:  WNL  Fund of knowledge:   Good  Insight:    Fair  Judgment:   Good  Impulse Control:  Good   Risk Assessment: Danger to Self:  No Self-injurious Behavior: No Danger to Others: No Duty to Warn:no Physical Aggression / Violence:No  Access to Firearms a concern: No  Gang Involvement:No    Subjective: Client reported having a lot of stress in her life right now and welling up with tears all the time. Client afraid her niece's mentally ill mother will take back her daughter Hope from kinship care with her aunt and client grieving that. Therapist used MI & grief therapy with client to validate her fear and help her process her grief about the probable outcome of court in Jan. 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 Client scared she went through school for no reason if she cant get a career job. Client trying to find a new job and find friends in her life who will hear her pain stress and validate her feelings, like her mom cant do. Client processed the stress of her mom putting her own stressors on [place] and the trauma of that happening as a child. Therapist used mindfulness with client to help her regulate and find a place of peace inside. Therapist assessed for safety and client denied any SI/HI/AVH. Therapist  encouraged client to go see a doctor for support with medication and client declined, saying it doesn't usually help her and has side effects she doesn't like.   Interventions: Mindfulness Meditation, Motivational Interviewing, Solution-Oriented/Positive Psychology, Grief Therapy, and RPT    Diagnosis:   ICD-10-CM   1. Generalized anxiety disorder  F41.1     2. Major depressive disorder, recurrent episode, moderate (HCC)  F33.1        Plan of Care:  Client is to return to therapy with therapist every 1-2 weeks as needed to process pain/grief/traumas in a safe space, to be re-evaluated in 3 months.  Client is to consider seeing a medication provider to explore possible need for medication to help them feel more like themselves. Client is to practice mindfulness AEB daily meditation and body scans or as needed when flooded by emotion/pain/flashbacks or feeling numb.  Client is to learn/practice DBT wise mind & radical acceptance AEB understanding they don't have to live in extremes and can feel both happy and sad emotions, and good and bad memories and allowing themselves to do so.  Client is to practice self-compassion AEB being gentle with themselves, utilizing self-care techniques daily or as needed when grieving something in the moment.  Client is to process grief/pain in a somatic body-felt sense way: ie using mindfulness, brainspotting, or trauma release as a method for releasing body pain/tension caused by  grief. Client to step outside of her comfort zone to really be "herself".  Pauline Good, LCSW, LCAS, CCTP, CCS-I, BSP

## 2021-07-28 ENCOUNTER — Ambulatory Visit: Payer: 59 | Admitting: Addiction (Substance Use Disorder)

## 2021-08-18 ENCOUNTER — Ambulatory Visit: Payer: 59 | Admitting: Addiction (Substance Use Disorder)

## 2021-09-21 DIAGNOSIS — Z124 Encounter for screening for malignant neoplasm of cervix: Secondary | ICD-10-CM | POA: Diagnosis not present

## 2021-09-21 DIAGNOSIS — Z01419 Encounter for gynecological examination (general) (routine) without abnormal findings: Secondary | ICD-10-CM | POA: Diagnosis not present

## 2021-09-21 DIAGNOSIS — Z113 Encounter for screening for infections with a predominantly sexual mode of transmission: Secondary | ICD-10-CM | POA: Diagnosis not present

## 2021-09-21 DIAGNOSIS — Z6822 Body mass index (BMI) 22.0-22.9, adult: Secondary | ICD-10-CM | POA: Diagnosis not present

## 2021-09-22 ENCOUNTER — Telehealth: Payer: Self-pay | Admitting: Addiction (Substance Use Disorder)

## 2021-09-22 NOTE — Telephone Encounter (Signed)
Dr. Doree Albee LVM asking you to call her back regarding input on Lori Gomez.   ? ?Phone number [phone] ?

## 2021-10-03 ENCOUNTER — Ambulatory Visit (INDEPENDENT_AMBULATORY_CARE_PROVIDER_SITE_OTHER): Payer: 59 | Admitting: Addiction (Substance Use Disorder)

## 2021-10-03 ENCOUNTER — Other Ambulatory Visit: Payer: Self-pay

## 2021-10-03 DIAGNOSIS — F411 Generalized anxiety disorder: Secondary | ICD-10-CM

## 2021-10-03 NOTE — Progress Notes (Signed)
?      [facility] Counselor/Therapist Progress Note ? ?Patient ID: PENNYE MACARIO, MRN: [id number],   ? ?Date: 10/03/2021 ? ?Time Spent: 42mins ? ?Treatment Type: Individual Therapy ? ?Reported Symptoms: upset, excited ? ?Mental Status Exam: ? ?Appearance:   Casual     ?Behavior:  Appropriate  ?Motor:  Normal  ?Speech/Language:   Clear and Coherent and Normal Rate  ?Affect:  Congruent and Full Range  ?Mood:  normal  ?Thought process:  normal  ?Thought content:    WNL  ?Sensory/Perceptual disturbances:    WNLs   ?Orientation:  x4  ?Attention:  Good  ?Concentration:  Fair  ?Memory:  WNL  ?Fund of knowledge:   Good  ?Insight:    Fair  ?Judgment:   Good  ?Impulse Control:  Good  ? ?Risk Assessment: ?Danger to Self:  No ?Self-injurious Behavior: No ?Danger to Others: No ?Duty to Warn:no ?Physical Aggression / Violence:No  ?Access to Firearms a concern: No  ?Gang Involvement:No  ? ? ?Subjective: Client reported feeling upset waiting for her job to call her back. 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 Client was stressed by working nights and having to go in for interviews/ assessments etc for the probation job, so she left her other jobs in hopes she would hear back about this one soon but hasnt yet. Client processed and therapist used MI & CBT with client to cheer her on and support her as she processed her restlessness in waiting on them. Client also excitedly processed having her own rental to herself and enjoying adulting at this time. Therapist assessed for safety and client denied any SI/HI/AVH.  ? ?Interventions: Cognitive Behavioral Therapy and Motivational Interviewing  ? ?Diagnosis: ?No diagnosis found. ? ?Plan of Care:  ?Client is to return to therapy with therapist every 1-2 weeks as needed to process pain/grief/traumas in a safe space, to be re-evaluated in 3 months.  ?Client is to consider seeing a medication provider to  explore possible need for medication to help them feel more like themselves. ?Client is to practice mindfulness AEB daily meditation and body scans or as needed when flooded by emotion/pain/flashbacks or feeling numb.  ?Client is to learn/practice DBT wise mind & radical acceptance AEB understanding they don't have to live in extremes and can feel both happy and sad emotions, and good and bad memories and allowing themselves to do so.  ?Client is to practice self-compassion AEB being gentle with themselves, utilizing self-care techniques daily or as needed when grieving something in the moment.  ?Client is to process grief/pain in a somatic body-felt sense way: ie using mindfulness, brainspotting, or trauma release as a method for releasing body pain/tension caused by grief. ?Client to step outside of her comfort zone to really be "herself". ? ?Barnie Del, LCSW, LCAS, CCTP, CCS-I, BSP ?

## 2021-10-19 ENCOUNTER — Ambulatory Visit (INDEPENDENT_AMBULATORY_CARE_PROVIDER_SITE_OTHER): Payer: 59 | Admitting: Addiction (Substance Use Disorder)

## 2021-10-19 DIAGNOSIS — F411 Generalized anxiety disorder: Secondary | ICD-10-CM

## 2021-10-19 NOTE — Progress Notes (Signed)
?      [facility] Counselor/Therapist Progress Note ? ?Patient ID: SYBELLA HARNISH, MRN: [id number],   ? ?Date: 10/19/2021 ? ?Time Spent: ? ?Treatment Type: Individual Therapy ? ?Reported Symptoms: relieved ? ?Mental Status Exam: ? ?Appearance:   Casual     ?Behavior:  Appropriate  ?Motor:  Normal  ?Speech/Language:   Clear and Coherent and Normal Rate  ?Affect:  Congruent and Full Range  ?Mood:  normal  ?Thought process:  normal  ?Thought content:    WNL  ?Sensory/Perceptual disturbances:    WNLs   ?Orientation:  x4  ?Attention:  Good  ?Concentration:  Fair  ?Memory:  WNL  ?Fund of knowledge:   Good  ?Insight:    Fair  ?Judgment:   Good  ?Impulse Control:  Good  ? ?Risk Assessment: ?Danger to Self:  No ?Self-injurious Behavior: No ?Danger to Others: No ?Duty to Warn:no ?Physical Aggression / Violence:No  ?Access to Firearms a concern: No  ?Gang Involvement:No  ? ? ?Subjective: Client reported feeling happy and relieved she got a job that she wasn't even looking for after getting turned down from the job she thought she wanted. Client expressed her desire to stay focused on her career and her life and stay away from her family drama. Client processed some anxieties she has about her sister's wellbeing and relationship and her worries; therapist used MI & CBT with client to support her as she processed and help to challenge irrational thoughts while also helping to calm her thoughts and focus on the present and only what she can control. Therapist assessed for safety and client denied any SI/HI/AVH.  ? ?Interventions: Cognitive Behavioral Therapy and Motivational Interviewing  ? ?Diagnosis: ?No diagnosis found. ? ? ?Plan of Care:  ?Client is to return to therapy with therapist every 1-2 weeks as needed to process pain/grief/traumas in a safe space, to be re-evaluated in 3 months.  ?Client is to consider seeing a medication provider to explore possible need for medication to help them feel more like  themselves. ?Client is to practice mindfulness AEB daily meditation and body scans or as needed when flooded by emotion/pain/flashbacks or feeling numb.  ?Client is to learn/practice DBT wise mind & radical acceptance AEB understanding they don't have to live in extremes and can feel both happy and sad emotions, and good and bad memories and allowing themselves to do so.  ?Client is to practice self-compassion AEB being gentle with themselves, utilizing self-care techniques daily or as needed when grieving something in the moment.  ?Client is to process grief/pain in a somatic body-felt sense way: ie using mindfulness, brainspotting, or trauma release as a method for releasing body pain/tension caused by grief. ?Client to step outside of her comfort zone to really be "herself". ? ?Pauline Good, LCSW, LCAS, CCTP, CCS-I, BSP ?

## 2021-11-02 ENCOUNTER — Ambulatory Visit: Payer: 59 | Admitting: Addiction (Substance Use Disorder)

## 2021-12-20 ENCOUNTER — Ambulatory Visit: Payer: 59 | Admitting: Addiction (Substance Use Disorder)

## 2022-01-18 ENCOUNTER — Ambulatory Visit: Payer: 59 | Admitting: Addiction (Substance Use Disorder)

## 2022-07-25 ENCOUNTER — Other Ambulatory Visit (HOSPITAL_COMMUNITY): Payer: Self-pay

## 2022-07-25 DIAGNOSIS — R112 Nausea with vomiting, unspecified: Secondary | ICD-10-CM | POA: Diagnosis not present

## 2022-07-25 DIAGNOSIS — R1011 Right upper quadrant pain: Secondary | ICD-10-CM | POA: Diagnosis not present

## 2022-07-25 MED ORDER — OMEPRAZOLE 20 MG PO CPDR
20.0000 mg | DELAYED_RELEASE_CAPSULE | Freq: Every day | ORAL | 0 refills | Status: AC
  Filled 2022-07-25: qty 7, 7d supply, fill #0

## 2022-07-25 MED ORDER — ONDANSETRON 4 MG PO TBDP
4.0000 mg | ORAL_TABLET | Freq: Three times a day (TID) | ORAL | 0 refills | Status: AC | PRN
  Filled 2022-07-25: qty 20, 7d supply, fill #0

## 2022-07-27 DIAGNOSIS — R112 Nausea with vomiting, unspecified: Secondary | ICD-10-CM | POA: Diagnosis not present

## 2022-07-27 DIAGNOSIS — R1011 Right upper quadrant pain: Secondary | ICD-10-CM | POA: Diagnosis not present

## 2022-10-01 ENCOUNTER — Encounter: Payer: Self-pay | Admitting: Addiction (Substance Use Disorder)

## 2022-10-08 ENCOUNTER — Other Ambulatory Visit (HOSPITAL_COMMUNITY): Payer: Self-pay

## 2022-10-08 MED ORDER — MELOXICAM 15 MG PO TABS
15.0000 mg | ORAL_TABLET | Freq: Every day | ORAL | 0 refills | Status: AC | PRN
  Filled 2022-10-08: qty 30, 30d supply, fill #0

## 2023-05-24 ENCOUNTER — Other Ambulatory Visit (HOSPITAL_COMMUNITY): Payer: Self-pay

## 2023-05-24 DIAGNOSIS — H66003 Acute suppurative otitis media without spontaneous rupture of ear drum, bilateral: Secondary | ICD-10-CM | POA: Diagnosis not present

## 2023-05-24 MED ORDER — FLUCONAZOLE 150 MG PO TABS
150.0000 mg | ORAL_TABLET | Freq: Every day | ORAL | 0 refills | Status: AC
  Filled 2023-05-24: qty 2, 2d supply, fill #0

## 2023-05-24 MED ORDER — AMOXICILLIN 500 MG PO CAPS
500.0000 mg | ORAL_CAPSULE | Freq: Three times a day (TID) | ORAL | 0 refills | Status: AC
  Filled 2023-05-24: qty 30, 10d supply, fill #0
